# Patient Record
Sex: Male | Born: 1960 | Race: Black or African American | Hispanic: No | Smoking: Never smoker
Health system: Southern US, Community
[De-identification: ages and names within clinical notes are randomized; demographics above are authoritative.]

## PROBLEM LIST (undated history)

## (undated) DIAGNOSIS — G4730 Sleep apnea, unspecified: Secondary | ICD-10-CM

## (undated) DIAGNOSIS — M542 Cervicalgia: Secondary | ICD-10-CM

## (undated) DIAGNOSIS — E785 Hyperlipidemia, unspecified: Secondary | ICD-10-CM

## (undated) DIAGNOSIS — Z87442 Personal history of urinary calculi: Secondary | ICD-10-CM

## (undated) DIAGNOSIS — M1990 Unspecified osteoarthritis, unspecified site: Secondary | ICD-10-CM

## (undated) DIAGNOSIS — G8929 Other chronic pain: Secondary | ICD-10-CM

## (undated) DIAGNOSIS — M109 Gout, unspecified: Secondary | ICD-10-CM

## (undated) DIAGNOSIS — T7840XA Allergy, unspecified, initial encounter: Secondary | ICD-10-CM

## (undated) DIAGNOSIS — M2550 Pain in unspecified joint: Secondary | ICD-10-CM

## (undated) HISTORY — PX: KNEE ARTHROSCOPY: SUR90

## (undated) HISTORY — DX: Allergy, unspecified, initial encounter: T78.40XA

## (undated) HISTORY — PX: OTHER SURGICAL HISTORY: SHX169

## (undated) HISTORY — PX: CIRCUMCISION: SUR203

## (undated) HISTORY — PX: SHOULDER SURGERY: SHX246

## (undated) HISTORY — PX: VASECTOMY: SHX75

---

## 2001-03-09 HISTORY — PX: CERVICAL FUSION: SHX112

## 2002-12-27 ENCOUNTER — Encounter: Payer: Self-pay | Admitting: Neurosurgery

## 2002-12-27 ENCOUNTER — Ambulatory Visit (HOSPITAL_COMMUNITY): Admission: RE | Admit: 2002-12-27 | Discharge: 2002-12-28 | Payer: Self-pay | Admitting: Neurosurgery

## 2004-02-12 ENCOUNTER — Encounter: Admission: RE | Admit: 2004-02-12 | Discharge: 2004-02-12 | Payer: Self-pay | Admitting: *Deleted

## 2006-03-09 HISTORY — PX: CARPAL TUNNEL RELEASE: SHX101

## 2007-02-19 ENCOUNTER — Encounter: Admission: RE | Admit: 2007-02-19 | Discharge: 2007-02-19 | Payer: Self-pay | Admitting: Orthopedic Surgery

## 2007-04-28 ENCOUNTER — Ambulatory Visit (HOSPITAL_BASED_OUTPATIENT_CLINIC_OR_DEPARTMENT_OTHER): Admission: RE | Admit: 2007-04-28 | Discharge: 2007-04-28 | Payer: Self-pay | Admitting: Orthopedic Surgery

## 2007-07-06 ENCOUNTER — Encounter: Admission: RE | Admit: 2007-07-06 | Discharge: 2007-07-06 | Payer: Self-pay | Admitting: Orthopedic Surgery

## 2007-07-14 ENCOUNTER — Encounter: Admission: RE | Admit: 2007-07-14 | Discharge: 2007-07-14 | Payer: Self-pay | Admitting: Orthopedic Surgery

## 2007-07-27 ENCOUNTER — Encounter: Admission: RE | Admit: 2007-07-27 | Discharge: 2007-07-27 | Payer: Self-pay | Admitting: Orthopedic Surgery

## 2008-08-15 ENCOUNTER — Encounter: Admission: RE | Admit: 2008-08-15 | Discharge: 2008-08-15 | Payer: Self-pay | Admitting: Internal Medicine

## 2009-02-26 ENCOUNTER — Encounter (INDEPENDENT_AMBULATORY_CARE_PROVIDER_SITE_OTHER): Payer: Self-pay | Admitting: *Deleted

## 2009-04-04 ENCOUNTER — Encounter (INDEPENDENT_AMBULATORY_CARE_PROVIDER_SITE_OTHER): Payer: Self-pay

## 2009-04-05 ENCOUNTER — Ambulatory Visit: Payer: Self-pay | Admitting: Gastroenterology

## 2009-04-18 ENCOUNTER — Ambulatory Visit: Payer: Self-pay | Admitting: Gastroenterology

## 2009-04-19 ENCOUNTER — Encounter: Payer: Self-pay | Admitting: Gastroenterology

## 2010-02-13 ENCOUNTER — Encounter
Admission: RE | Admit: 2010-02-13 | Discharge: 2010-02-13 | Payer: Self-pay | Source: Home / Self Care | Admitting: Family Medicine

## 2010-04-10 NOTE — Miscellaneous (Signed)
Summary: Lec previsit  Clinical Lists Changes  Medications: Added new medication of MOVIPREP 100 GM  SOLR (PEG-KCL-NACL-NASULF-NA ASC-C) As per prep instructions. - Signed Rx of MOVIPREP 100 GM  SOLR (PEG-KCL-NACL-NASULF-NA ASC-C) As per prep instructions.;  #1 x 0;  Signed;  Entered by: Ulis Rias RN;  Authorized by: Meryl Dare MD [facility];  Method used: Electronically to [facility] [address], [place], [place], [place]  [address], Ph: [phone], Fax: [phone] Observations: Added new observation of NKA: T (04/05/2009 16:12)    Prescriptions: MOVIPREP 100 GM  SOLR (PEG-KCL-NACL-NASULF-NA ASC-C) As per prep instructions.  #1 x 0   Entered by:   Ulis Rias RN   Authorized by:   Meryl Dare MD [facility]   Signed by:   Ulis Rias RN on 04/05/2009   Method used:   Electronically to        [facility]  [address] (retail)       [address]       [place], [place]  [address]       Ph: [phone]       Fax: [phone]   RxID:   [id number]

## 2010-04-10 NOTE — Letter (Signed)
Summary: Moviprep Instructions  [facility]  [address]   [place], [place] [address]   Phone: [phone]  Fax: [phone]       [facility]    [birth year]    MRN: [id number]        Procedure Day /Date: Thursday 04-18-09     Arrival Time: 8:00 a.m.     Procedure Time: 9:00 a.m.     Location of Procedure:                    _x _  [facility] (4th Floor)                        PREPARATION FOR COLONOSCOPY WITH MOVIPREP   Starting 5 days prior to your procedure 04-13-09  do not eat nuts, seeds, popcorn, corn, beans, peas,  salads, or any raw vegetables.  Do not take any fiber supplements (e.g. Metamucil, Citrucel, and Benefiber).  THE DAY BEFORE YOUR PROCEDURE         DATE:  04-17-09   DAY: Wednesday  1.  Drink clear liquids the entire day-NO SOLID FOOD  2.  Do not drink anything colored red or purple.  Avoid juices with pulp.  No orange juice.  3.  Drink at least 64 oz. (8 glasses) of fluid/clear liquids during the day to prevent dehydration and help the prep work efficiently.  CLEAR LIQUIDS INCLUDE: Water Jello Ice Popsicles Tea (sugar ok, no milk/cream) Powdered fruit flavored drinks Coffee (sugar ok, no milk/cream) Gatorade Juice: apple, white grape, white cranberry  Lemonade Clear bullion, consomm, broth Carbonated beverages (any kind) Strained chicken noodle soup Hard Candy                             4.  In the morning, mix first dose of MoviPrep solution:    Empty 1 Pouch A and 1 Pouch B into the disposable container    Add lukewarm drinking water to the top line of the container. Mix to dissolve    Refrigerate (mixed solution should be used within 24 hrs)  5.  Begin drinking the prep at 5:00 p.m. The MoviPrep container is divided by 4 marks.   Every 15 minutes drink the solution down to the next mark (approximately 8 oz) until the full liter is complete.   6.  Follow completed prep with 16 oz of clear liquid of your choice  (Nothing red or purple).  Continue to drink clear liquids until bedtime.  7.  Before going to bed, mix second dose of MoviPrep solution:    Empty 1 Pouch A and 1 Pouch B into the disposable container    Add lukewarm drinking water to the top line of the container. Mix to dissolve    Refrigerate  THE DAY OF YOUR PROCEDURE      DATE: 04-18-09  DAY: Thursday  Beginning at 4:00 a.m. (5 hours before procedure):         1. Every 15 minutes, drink the solution down to the next mark (approx 8 oz) until the full liter is complete.  2. Follow completed prep with 16 oz. of clear liquid of your choice.    3. You may drink clear liquids until 7:00 a.m.  (2 HOURS BEFORE PROCEDURE).   MEDICATION INSTRUCTIONS  Unless otherwise instructed, you should take regular prescription medications with a small sip of water   as early  as possible the morning of your procedure.         OTHER INSTRUCTIONS  You will need a responsible adult at least 50 years of age to accompany you and drive you home.   This person must remain in the waiting room during your procedure.  Wear loose fitting clothing that is easily removed.  Leave jewelry and other valuables at home.  However, you may wish to bring a book to read or  an iPod/MP3 player to listen to music as you wait for your procedure to start.  Remove all body piercing jewelry and leave at home.  Total time from sign-in until discharge is approximately 2-3 hours.  You should go home directly after your procedure and rest.  You can resume normal activities the  day after your procedure.  The day of your procedure you should not:   Drive   Make legal decisions   Operate machinery   Drink alcohol   Return to work  You will receive specific instructions about eating, activities and medications before you leave.    The above instructions have been reviewed and explained to me by   Ulis Rias RN  April 05, 2009 4:23 PM     I fully  understand and can verbalize these instructions _____________________________ Date _________

## 2010-04-10 NOTE — Procedures (Signed)
Summary: Colonoscopy  Patient: [name] Note: All result statuses are Final unless otherwise noted.  Tests: (1) Colonoscopy (COL)   COL Colonoscopy           DONE     [facility]     [address]     [place], [place]  [address]           COLONOSCOPY PROCEDURE REPORT           PATIENT:  [name], [name]  MR#:  [id number]     BIRTHDATE:  December 18, 1960, 48 yrs. old  GENDER:  male           ENDOSCOPIST:  Judie Petit T. Russella Dar, MD, [facility]     Referred by:  Robert Bellow, M.D.           PROCEDURE DATE:  04/18/2009     PROCEDURE:  Colonoscopy with biopsy     ASA CLASS:  Class II     INDICATIONS:  1) Routine Risk Screening           MEDICATIONS:   Fentanyl 50 mcg IV, Versed 6 mg IV           DESCRIPTION OF PROCEDURE:   After the risks benefits and     alternatives of the procedure were thoroughly explained, informed     consent was obtained.  Digital rectal exam was performed and     revealed no abnormalities.   The LB PCF-H180AL [id number] endoscope     was introduced through the anus and advanced to the cecum, which     was identified by both the appendix and ileocecal valve, without     limitations.  The quality of the prep was excellent, using     MoviPrep.  The instrument was then slowly withdrawn as the colon     was fully examined.     <<PROCEDUREIMAGES>>           FINDINGS:  A sessile polyp was found in the cecum. It was 3 mm in     size. The polyp was removed using cold biopsy forceps.  Mild     diverticulosis was found in the sigmoid colon. This was otherwise     a normal examination of the colon. Retroflexed views in the rectum     revealed internal hemorrhoids, medium. The time to cecum =  1.75     minutes. The scope was then withdrawn (time =  11  min) from the     patient and the procedure completed.           COMPLICATIONS:  None           ENDOSCOPIC IMPRESSION:     1) 3 mm sessile polyp     2) Mild diverticulosis     3) Internal hemorrhoids        RECOMMENDATIONS:     1) Await pathology results     2) High fiber diet     3) If the polyp removed today is adenomatous (pre-cancerous),     you will need a repeat colonoscopy in 5 years. Otherwise you     should continue to follow colorectal cancer screening guidelines     for "routine risk" patients with colonoscopy in 10 years.     Venita Lick. Russella Dar, MD, [facility]           n.     eSIGNED:   Venita Lick. Stark at 04/18/2009 09:35 AM  Keith, Felten, [phone]  Note: An exclamation mark (!) indicates a result that was not dispersed into the flowsheet. Document Creation Date: 04/18/2009 9:35 AM _______________________________________________________________________  (1) Order result status: Final Collection or observation date-time: 04/18/2009 09:28 Requested date-time:  Receipt date-time:  Reported date-time:  Referring Physician:   Ordering Physician: Claudette Head [phone]) Specimen Source:  Source: Launa Grill Order Number: [id number] Lab site:   Appended Document: Colonoscopy     Procedures Next Due Date:    Colonoscopy: 04/2019

## 2010-04-10 NOTE — Letter (Signed)
Summary: Patient Notice- Colon Biospy Results  [facility]  [address] [place], [place] [address]   Phone: [phone]  Fax: [phone]        April 19, 2009 MRN: [id number]    [name] [facility] [address] [place], [place]  [address]    Dear [name],  I am pleased to inform you that the biopsies taken during your recent colonoscopy did not show any evidence of cancer upon pathologic examination. The biopsy showed polypoid normal mucosa. There was no polyp.  Continue with the treatment plan as outlined on the day of your      exam.  You should have a repeat colonoscopy examination in 10 years.  Please call us if you are having persistent problems or have questions about your condition that have not been fully answered at this time.  Sincerely,  Meryl Dare MD [facility]  This letter has been electronically signed by your physician.  Appended Document: Patient Notice- Colon Biospy Results Letter mailed 2.16.11

## 2010-07-22 NOTE — Op Note (Signed)
[name], [name] NO.:  [id number]   MEDICAL RECORD NO.:  [id number]          PATIENT TYPE:  AMB   LOCATION:  [facility]                         FACILITY:  [facility]   PHYSICIAN:  Deidre Ala, M.D.    DATE OF BIRTH:  Aug 12, 1960   DATE OF PROCEDURE:  04/28/2007  DATE OF DISCHARGE:                               OPERATIVE REPORT   PREOPERATIVE DIAGNOSIS:  Right carpal tunnel syndrome.   POSTOPERATIVE DIAGNOSIS:  Right carpal tunnel syndrome.   PROCEDURE:  Right carpal tunnel release.   SURGEON:  Doristine Section, MD.   ASSISTANT:  Phineas Semen, P.A.   ANESTHESIA:  General with LMA.   CULTURES:  None.   DRAINS:  None.   ESTIMATED BLOOD LOSS:  Minimal.   TOURNIQUET TIME:  25 minutes.   PATHOLOGIC FINDINGS AND HISTORY:  Wisdom has had shoulder arthroscopy with  good result, persistent shoulder and arm pain.  He has had effusion in  his neck, cervical 4-5 and there was a question of whether or not  recently there was some sort of breakdown above or below the fusion mass  or in the fusion mass with a six radiculopathy. The patient went to see  Dr. Alveda Reasons who felt like the most abnormal segment was 4-5 but that he  had a possibly carpal tunnel syndrome and nerve conduction studies were  done by Dr. Regino Schultze on March 23, 2007 which showed moderate severe right  median mononeuropathy. At this point, the feeling was that we should  release the right carpal tunnel. Sometimes the pain goes all the way  from the hand to the shoulder with carpal tunnel syndrome and that we  would then released the carpal tunnel, see what his symptoms were and if  he continued to have discomfort in the neck and arm consider selective  nerve root block right. At surgery, classic findings were noted with  hour glassing with a thick transverse carpal ligament with a thick  palmar fascia.  The nerve was well released, all branches traced  distally including the motor branch and the nerve release  well up into  the forearm on the ulnar side.   DESCRIPTION OF PROCEDURE:  With adequate anesthesia obtained using LMA  technique, 1 gram Ancef given IV prophylaxis, the patient was placed in  the supine position. The right upper extremity was prepped from the  fingertips to the upper forearm in a standard fashion. After standard  prepping and draping, Esmarch examination was used, the tourniquet was  let up 275 mmHg.  A longitudinal incision was then made at the base of  the palm in the midline to the distal wrist flexion crease.  The  incision was deepened sharply with the knife and hemostasis obtained  using the Bovie electrocoagulator. Under loupe magnification, dissection  was carried down to the palmar fascia which was then entered by placing  a Therapist, nutritional underneath it, protecting the nerve and cutting down  upon it with a 64 Beaver blade.  This exposed the nerve. Careful scissor  neurolysis was then carried out with tenotomies  and all branches were  traced distal including the motor branch.  Epineurium was removed from  the volar one-half of the nerve. The distal wrist flexor retinaculum was  then released on the ulnar side of the nerve well up into the forearm  and part of the transverse retinaculum was excised. The nerve was then  felt to be decompressed. Irrigation was carried out. The wound was then  closed with interrupted and running for 4-0 nylon.  A bulky sterile  compressive dressing was applied with volar plaster splint in slight  cock-up.  The patient then having tolerated the procedure well was  awakened and taken to the recovery room in satisfactory condition to be  discharged per outpatient routine, given Percocet for pain and told to  call the office for a recheck on Monday.           ______________________________  V. Charlesetta Shanks, M.D.     VEP/MEDQ  D:  04/28/2007  T:  04/29/2007  Job:  [id number]   cc:   Claria Dice, MD   Nelda Severe, MD  Fax: [phone]

## 2010-07-25 NOTE — Op Note (Signed)
[name], [name]                            ACCOUNT NO.:  [id number]   MEDICAL RECORD NO.:  [id number]                   PATIENT TYPE:  OIB   LOCATION:  3172                                 FACILITY:  [facility]   PHYSICIAN:  Hewitt Shorts, M.D.            DATE OF BIRTH:  December 09, 1960   DATE OF PROCEDURE:  12/27/2002  DATE OF DISCHARGE:                                 OPERATIVE REPORT   PREOPERATIVE DIAGNOSES:  1. 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  2. Spondylosis.  3. Degenerative disk disease.  4. Radiculopathy.   POSTOPERATIVE DIAGNOSES:  1. 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  2. Spondylosis.  3. Degenerative disk disease.  4. Radiculopathy.   PROCEDURES:  C5-6 and C6-7 anterior cervical diskectomy and decompression  and arthrodesis with iliac crest Allograft and Tether cervical plating.   SURGEON:  Hewitt Shorts, M.D.   ASSISTANT:  Clydene Fake, M.D.   ANESTHESIA:  General endotracheal anesthesia.   INDICATIONS FOR PROCEDURE:  The patient is a 50 year old man who presented  with neck pain and right cervical radiculopathy who was found on MRI scan to  have disk herniations at C5-6 and C6-7.  The decision was made to proceed  with a two level anterior cervical diskectomy and arthrodesis.   PROCEDURE:  The patient was brought to the operating room and placed under  general endotracheal anesthesia.  The patient was placed in ten pounds of  halter traction.  The neck was draped with Betadine soap solution and draped  in sterile fashion.  A horizontal incision was made on the left side of the  neck.  The line of incision was infiltrated with 1% lidocaine with  epinephrine.  Dissection was carried down through the subcutaneous tissue  and platysma.  Then dissection was carried out through an avascular plane  leaving the sternocleidomastoid, carotid artery and jugular vein laterally  and the trachea and esophagus medially.  The ventral aspect of the  vertebral  column was identified and a localizing x-ray taken.  We were able to  identify the C4-5 and C5-6 intervertebral disk spaces and thereby the C6-7  intervertebral disk space.  We then proceeded with diskectomy at the C5-6  and C6-7 levels with an incision of the anterior annulus.  The diskectomy  was continued with microcurettes and pituitary rongeurs.  Anterior  osteophytic overgrowth was removed using the osteophyte removal tool.  The  cauterized end plates of the corresponding vertebrae were removed using  microcurettes along with the MicroVAX drill.  Then the microscope was draped  and brought on to the field to provide microdissection, illumination and  visualization and the remainder of the diskectomy and compression was  performed using microdissection and microsurgical technique.  The posterior  osteophytic overgrowth was removed using the MicroVAX drill along with the 2  mm Kerrison punch with a thin foot plate.  The posterior longitudinal  ligament was carefully opened and removed at each level and the disk  herniation was removed.  We then completed the decompression of the spinal  canal and thecal sac as well as the foramina and nerve roots bilaterally at  each level.  Once the decompression was completed hemostasis was established  with the use of Gelfoam soaked in Thrombin. We then selected two 7 mm in  height grafts of tricortical iliac crest Allograft.  The size had been  selected using spacers to size the intervertebral disk space at each level.  Once the grafts were opened we then hydrated them in saline and their  position in the intervertebral disk space at each level and counter sunk. We  then selected a 32 mm tether cervical plate which was positioned over the  fusion construct and secured to the vertebra with a variety of screws.  At  C5 the left screw was a 4.5 x 15 mm screw and the left side was a 4.0 x 14  mm screw.  At C6 there was a 4.5 x 13 mm screw and  at C7 it was a pair of  4.0 x 14 mm screws.  Each screw hole was drilled and tapped and the screws  were placed in an alternating fashion.  They were fully tightened.  The  wound was then irrigated with Bacitracin solution and checked for  hemostasis.  When this was established and confirmed then we proceeded with  closure.  An x-ray was not taken due to the fact that the patient's  shoulders were large and we were unable to see the C5-6 and C6-7 disk spaces  on the initial x-ray.  It was not felt that we would be able to visualize  them.  However, under direct visualization the fusion construct appeared  good with good alignment and the bone grafts in good position.  The platysma  was closed with interrupted inverted 2-0 running Vicryl sutures.  The  subcutaneous and subcuticular layers were closed with interrupted buried 3-0  running Vicryl sutures.  The skin was reapproximated with Dermabond.  The  patient tolerated the procedure well.  Estimated blood loss was 100 mL.  Sponge and needle counts were correct. Following the surgery the patient who  had been taken out of cervical traction once the bone grafts were in place  and prior to plating with reverse anesthetic, extubated and transferred to  the recovery room for further care where he was noted to be moving all four  extremities to command.                                               Hewitt Shorts, M.D.    RWN/MEDQ  D:  12/27/2002  T:  12/27/2002  Job:  [id number]

## 2010-11-28 LAB — POCT HEMOGLOBIN-HEMACUE: Hemoglobin: 15

## 2011-03-25 ENCOUNTER — Ambulatory Visit (INDEPENDENT_AMBULATORY_CARE_PROVIDER_SITE_OTHER): Payer: 59

## 2011-03-25 DIAGNOSIS — J40 Bronchitis, not specified as acute or chronic: Secondary | ICD-10-CM

## 2011-03-25 DIAGNOSIS — G4733 Obstructive sleep apnea (adult) (pediatric): Secondary | ICD-10-CM

## 2011-03-25 DIAGNOSIS — J069 Acute upper respiratory infection, unspecified: Secondary | ICD-10-CM

## 2011-04-14 ENCOUNTER — Other Ambulatory Visit: Payer: Self-pay | Admitting: Internal Medicine

## 2011-04-14 MED ORDER — CLINDAMYCIN PHOSPHATE 1 % EX LOTN: TOPICAL_LOTION | Freq: Two times a day (BID) | CUTANEOUS | Status: DC

## 2011-05-14 ENCOUNTER — Other Ambulatory Visit: Payer: Self-pay | Admitting: Orthopedic Surgery

## 2011-05-18 ENCOUNTER — Encounter (HOSPITAL_BASED_OUTPATIENT_CLINIC_OR_DEPARTMENT_OTHER): Payer: Self-pay

## 2011-05-18 ENCOUNTER — Encounter (HOSPITAL_BASED_OUTPATIENT_CLINIC_OR_DEPARTMENT_OTHER): Payer: Self-pay | Admitting: Anesthesiology

## 2011-05-18 ENCOUNTER — Ambulatory Visit (HOSPITAL_BASED_OUTPATIENT_CLINIC_OR_DEPARTMENT_OTHER): Payer: 59 | Admitting: Anesthesiology

## 2011-05-18 ENCOUNTER — Ambulatory Visit (HOSPITAL_BASED_OUTPATIENT_CLINIC_OR_DEPARTMENT_OTHER)
Admission: RE | Admit: 2011-05-18 | Discharge: 2011-05-18 | Disposition: A | Discharge status: Home / Self Care | Payer: 59 | Source: Ambulatory Visit | Attending: Orthopedic Surgery | Admitting: Orthopedic Surgery

## 2011-05-18 ENCOUNTER — Encounter (HOSPITAL_BASED_OUTPATIENT_CLINIC_OR_DEPARTMENT_OTHER)
Admission: RE | Disposition: A | Discharge status: Home / Self Care | Payer: Self-pay | Source: Ambulatory Visit | Attending: Orthopedic Surgery

## 2011-05-18 ENCOUNTER — Encounter (HOSPITAL_BASED_OUTPATIENT_CLINIC_OR_DEPARTMENT_OTHER): Payer: Self-pay | Admitting: Certified Registered Nurse Anesthetist

## 2011-05-18 DIAGNOSIS — M66329 Spontaneous rupture of flexor tendons, unspecified upper arm: Secondary | ICD-10-CM | POA: Insufficient documentation

## 2011-05-18 DIAGNOSIS — M7541 Impingement syndrome of right shoulder: Secondary | ICD-10-CM

## 2011-05-18 DIAGNOSIS — G4730 Sleep apnea, unspecified: Secondary | ICD-10-CM | POA: Insufficient documentation

## 2011-05-18 DIAGNOSIS — Z5333 Arthroscopic surgical procedure converted to open procedure: Secondary | ICD-10-CM | POA: Insufficient documentation

## 2011-05-18 DIAGNOSIS — M129 Arthropathy, unspecified: Secondary | ICD-10-CM | POA: Insufficient documentation

## 2011-05-18 DIAGNOSIS — M25819 Other specified joint disorders, unspecified shoulder: Secondary | ICD-10-CM | POA: Insufficient documentation

## 2011-05-18 DIAGNOSIS — Z01812 Encounter for preprocedural laboratory examination: Secondary | ICD-10-CM | POA: Insufficient documentation

## 2011-05-18 DIAGNOSIS — M9420 Chondromalacia, unspecified site: Secondary | ICD-10-CM | POA: Insufficient documentation

## 2011-05-18 HISTORY — DX: Sleep apnea, unspecified: G47.30

## 2011-05-18 SURGERY — SHOULDER ARTHROSCOPY WITH SUBACROMIAL DECOMPRESSION
Anesthesia: General | Site: Shoulder | Laterality: Right | Wound class: Clean

## 2011-05-18 MED ORDER — PROPOFOL 10 MG/ML IV EMUL
INTRAVENOUS | Status: DC | PRN
  Administered 2011-05-18: 300 mg via INTRAVENOUS
  Administered 2011-05-18: 30 mg via INTRAVENOUS

## 2011-05-18 MED ORDER — POVIDONE-IODINE 7.5 % EX SOLN: Freq: Once | CUTANEOUS | Status: DC

## 2011-05-18 MED ORDER — SUCCINYLCHOLINE CHLORIDE 20 MG/ML IJ SOLN
INTRAMUSCULAR | Status: DC | PRN
  Administered 2011-05-18: 100 mg via INTRAVENOUS

## 2011-05-18 MED ORDER — SODIUM CHLORIDE 0.9 % IR SOLN
Status: DC | PRN
  Administered 2011-05-18: 8500 mL

## 2011-05-18 MED ORDER — FENTANYL CITRATE 0.05 MG/ML IJ SOLN
50.0000 ug | INTRAMUSCULAR | Status: DC | PRN
  Administered 2011-05-18: 100 ug via INTRAVENOUS

## 2011-05-18 MED ORDER — LIDOCAINE HCL (CARDIAC) 20 MG/ML IV SOLN
INTRAVENOUS | Status: DC | PRN
  Administered 2011-05-18: 50 mg via INTRAVENOUS

## 2011-05-18 MED ORDER — BUPIVACAINE HCL 0.25 % IJ SOLN
INTRAMUSCULAR | Status: DC | PRN
  Administered 2011-05-18: 20 mL

## 2011-05-18 MED ORDER — MIDAZOLAM HCL 2 MG/2ML IJ SOLN
1.0000 mg | INTRAMUSCULAR | Status: DC | PRN
  Administered 2011-05-18: 2 mg via INTRAVENOUS

## 2011-05-18 MED ORDER — OXYCODONE-ACETAMINOPHEN 5-325 MG PO TABS: 1.0000 | ORAL_TABLET | ORAL | Status: AC | PRN

## 2011-05-18 MED ORDER — DEXAMETHASONE SODIUM PHOSPHATE 4 MG/ML IJ SOLN
INTRAMUSCULAR | Status: DC | PRN
  Administered 2011-05-18: 10 mg via INTRAVENOUS

## 2011-05-18 MED ORDER — EPHEDRINE SULFATE 50 MG/ML IJ SOLN
INTRAMUSCULAR | Status: DC | PRN
  Administered 2011-05-18: 10 mg via INTRAVENOUS

## 2011-05-18 MED ORDER — BUPIVACAINE-EPINEPHRINE PF 0.5-1:200000 % IJ SOLN
INTRAMUSCULAR | Status: DC | PRN
  Administered 2011-05-18: 30 mL

## 2011-05-18 MED ORDER — LACTATED RINGERS IV SOLN
INTRAVENOUS | Status: DC
  Administered 2011-05-18: 11:00:00 via INTRAVENOUS

## 2011-05-18 MED ORDER — GLYCOPYRROLATE 0.2 MG/ML IJ SOLN
INTRAMUSCULAR | Status: DC | PRN
  Administered 2011-05-18: 0.2 mg via INTRAVENOUS

## 2011-05-18 MED ORDER — ONDANSETRON HCL 4 MG/2ML IJ SOLN
INTRAMUSCULAR | Status: DC | PRN
  Administered 2011-05-18: 4 mg via INTRAVENOUS

## 2011-05-18 MED ORDER — CEFAZOLIN SODIUM-DEXTROSE 2-3 GM-% IV SOLR
2.0000 g | INTRAVENOUS | Status: AC
  Administered 2011-05-18: 2 g via INTRAVENOUS

## 2011-05-18 SURGICAL SUPPLY — 94 items
BENZOIN TINCTURE PRP APPL 2/3 (GAUZE/BANDAGES/DRESSINGS) IMPLANT
BIT DRILL 3.6X128 (BIT) ×2 IMPLANT
BIT DRILL 4.8MMDIAX5IN DISPOSE (BIT) ×1 IMPLANT
BLADE 4.2CUDA (BLADE) IMPLANT
BLADE CUDA 5.5 (BLADE) IMPLANT
BLADE CUTTER GATOR 3.5 (BLADE) IMPLANT
BLADE GREAT WHITE 4.2 (BLADE) IMPLANT
BLADE SURG 15 STRL LF DISP TIS (BLADE) ×1 IMPLANT
BLADE SURG 15 STRL SS (BLADE) ×1
BLADE SURG ROTATE 9660 (MISCELLANEOUS) IMPLANT
BLADE VORTEX 6.0 (BLADE) IMPLANT
BUR 3.5 LG SPHERICAL (BURR) IMPLANT
BUR OVAL 4.0 (BURR) ×2 IMPLANT
BUR OVAL 6.0 (BURR) IMPLANT
BURR 3.5 LG SPHERICAL (BURR)
CANISTER OMNI JUG 16 LITER (MISCELLANEOUS) ×2 IMPLANT
CANISTER SUCTION 2500CC (MISCELLANEOUS) IMPLANT
CANNULA 5.75X71 LONG (CANNULA) ×4 IMPLANT
CANNULA TWIST IN 8.25X7CM (CANNULA) IMPLANT
CHLORAPREP W/TINT 26ML (MISCELLANEOUS) ×2 IMPLANT
CLOTH BEACON ORANGE TIMEOUT ST (SAFETY) ×2 IMPLANT
DECANTER SPIKE VIAL GLASS SM (MISCELLANEOUS) IMPLANT
DERMABOND ADVANCED (GAUZE/BANDAGES/DRESSINGS) ×1
DERMABOND ADVANCED .7 DNX12 (GAUZE/BANDAGES/DRESSINGS) ×1 IMPLANT
DRAPE INCISE IOBAN 66X45 STRL (DRAPES) ×2 IMPLANT
DRAPE OEC MINIVIEW 54X84 (DRAPES) IMPLANT
DRAPE STERI 35X30 U-POUCH (DRAPES) ×2 IMPLANT
DRAPE SURG 17X23 STRL (DRAPES) ×2 IMPLANT
DRAPE U 20/CS (DRAPES) ×2 IMPLANT
DRAPE U-SHAPE 47X51 STRL (DRAPES) ×2 IMPLANT
DRAPE U-SHAPE 76X120 STRL (DRAPES) ×4 IMPLANT
DRILL BIT 4.8MMDIAX5IN DISPOSE (BIT) ×2
DRSG PAD ABDOMINAL 8X10 ST (GAUZE/BANDAGES/DRESSINGS) ×2 IMPLANT
ELECT REM PT RETURN 9FT ADLT (ELECTROSURGICAL) ×2
ELECTRODE REM PT RTRN 9FT ADLT (ELECTROSURGICAL) ×1 IMPLANT
GAUZE SPONGE 4X4 16PLY XRAY LF (GAUZE/BANDAGES/DRESSINGS) IMPLANT
GAUZE XEROFORM 1X8 LF (GAUZE/BANDAGES/DRESSINGS) ×2 IMPLANT
GLOVE BIO SURGEON STRL SZ7.5 (GLOVE) ×4 IMPLANT
GLOVE BIOGEL M STRL SZ7.5 (GLOVE) ×2 IMPLANT
GLOVE BIOGEL PI IND STRL 8 (GLOVE) ×2 IMPLANT
GLOVE BIOGEL PI INDICATOR 8 (GLOVE) ×2
GLOVE INDICATOR 8.0 STRL GRN (GLOVE) ×4 IMPLANT
GLOVE SURG SS PI 8.0 STRL IVOR (GLOVE) ×2 IMPLANT
GOWN PREVENTION PLUS XLARGE (GOWN DISPOSABLE) ×4 IMPLANT
GOWN PREVENTION PLUS XXLARGE (GOWN DISPOSABLE) ×2 IMPLANT
LASSO CRESCENT QUICKPASS (SUTURE) ×2 IMPLANT
NDL SUT 6 .5 CRC .975X.05 MAYO (NEEDLE) IMPLANT
NEEDLE 1/2 CIR CATGUT .05X1.09 (NEEDLE) IMPLANT
NEEDLE MAYO TAPER (NEEDLE)
NEEDLE SCORPION MULTI FIRE (NEEDLE) IMPLANT
NS IRRIG 1000ML POUR BTL (IV SOLUTION) IMPLANT
PACK ARTHROSCOPY DSU (CUSTOM PROCEDURE TRAY) ×2 IMPLANT
PACK BASIN DAY SURGERY FS (CUSTOM PROCEDURE TRAY) ×2 IMPLANT
PENCIL BUTTON HOLSTER BLD 10FT (ELECTRODE) ×2 IMPLANT
RESECTOR FULL RADIUS 4.2MM (BLADE) ×2 IMPLANT
RESECTOR FULL RADIUS 4.8MM (BLADE) IMPLANT
SHEET MEDIUM DRAPE 40X70 STRL (DRAPES) IMPLANT
SLEEVE SCD COMPRESS KNEE MED (MISCELLANEOUS) ×2 IMPLANT
SLING ARM FOAM STRAP LRG (SOFTGOODS) IMPLANT
SLING ARM FOAM STRAP MED (SOFTGOODS) IMPLANT
SLING ARM FOAM STRAP XLG (SOFTGOODS) ×2 IMPLANT
SLING ARM IMMOBILIZER MED (SOFTGOODS) IMPLANT
SPONGE GAUZE 4X4 12PLY (GAUZE/BANDAGES/DRESSINGS) ×2 IMPLANT
SPONGE LAP 4X18 X RAY DECT (DISPOSABLE) ×4 IMPLANT
STRIP CLOSURE SKIN 1/2X4 (GAUZE/BANDAGES/DRESSINGS) IMPLANT
SUCTION FRAZIER TIP 10 FR DISP (SUCTIONS) IMPLANT
SUPPORT WRAP ARM LG (MISCELLANEOUS) IMPLANT
SUT 2 FIBERLOOP 20 STRT BLUE (SUTURE) ×2
SUT BONE WAX W31G (SUTURE) IMPLANT
SUT ETHIBOND 2 OS 4 DA (SUTURE) IMPLANT
SUT ETHILON 3 0 PS 1 (SUTURE) ×2 IMPLANT
SUT ETHILON 4 0 PS 2 18 (SUTURE) IMPLANT
SUT FIBERWIRE #2 38 T-5 BLUE (SUTURE)
SUT FIBERWIRE 2-0 18 17.9 3/8 (SUTURE)
SUT MNCRL AB 3-0 PS2 18 (SUTURE) IMPLANT
SUT MNCRL AB 4-0 PS2 18 (SUTURE) IMPLANT
SUT PDS AB 0 CT 36 (SUTURE) IMPLANT
SUT PROLENE 3 0 PS 2 (SUTURE) IMPLANT
SUT VIC AB 0 CT1 18XCR BRD 8 (SUTURE) IMPLANT
SUT VIC AB 0 CT1 8-18 (SUTURE)
SUT VIC AB 2-0 SH 18 (SUTURE) ×2 IMPLANT
SUT VIC AB 2-0 SH 27 (SUTURE) ×1
SUT VIC AB 2-0 SH 27XBRD (SUTURE) ×1 IMPLANT
SUTURE 2 FIBERLOOP 20 STRT BLU (SUTURE) ×1 IMPLANT
SUTURE FIBERWR #2 38 T-5 BLUE (SUTURE) IMPLANT
SUTURE FIBERWR 2-0 18 17.9 3/8 (SUTURE) IMPLANT
SYR BULB 3OZ (MISCELLANEOUS) IMPLANT
TOWEL OR 17X24 6PK STRL BLUE (TOWEL DISPOSABLE) ×4 IMPLANT
TOWEL OR NON WOVEN STRL DISP B (DISPOSABLE) ×2 IMPLANT
TUBE CONNECTING 20X1/4 (TUBING) ×2 IMPLANT
TUBING ARTHROSCOPY IRRIG 16FT (MISCELLANEOUS) ×2 IMPLANT
WAND STAR VAC 90 (SURGICAL WAND) ×2 IMPLANT
WATER STERILE IRR 1000ML POUR (IV SOLUTION) ×2 IMPLANT
YANKAUER SUCT BULB TIP NO VENT (SUCTIONS) IMPLANT

## 2011-05-18 NOTE — Progress Notes (Signed)
Assisted Dr. Jackson with right, interscalene  block. Side rails up, monitors on throughout procedure. See vital signs in flow sheet. Tolerated Procedure well. 

## 2011-05-18 NOTE — Anesthesia Procedure Notes (Addendum)
Anesthesia Regional Block:  Interscalene brachial plexus block  Pre-Anesthetic Checklist: ,, timeout performed, Correct Patient, Correct Site, Correct Laterality, Correct Procedure, Correct Position, site marked, Risks and benefits discussed,  Surgical consent,  Pre-op evaluation,  At surgeon's request and post-op pain management  Laterality: Right  Prep: chloraprep       Needles:  Injection technique: Single-shot  Needle Type: Stimulator Needle - 40      Needle Gauge: 22 and 22 G    Additional Needles:  Procedures: nerve stimulator Interscalene brachial plexus block  Nerve Stimulator or Paresthesia:  Response: forearm twitch, 0.48 mA, 0.1 ms,   Additional Responses:   Narrative:  Start time: 05/18/2011 12:13 PM End time: 05/18/2011 12:20 PM Injection made incrementally with aspirations every 5 mL.  Performed by: Personally  Anesthesiologist: Sandford Craze, MD  Additional Notes: Pt identified in Holding room.  Monitors applied. Working IV access confirmed. Sterile prep R neck.  #22ga PNS to forearm twitch at 0.59mA threshold.  30cc 0.5% Bupivacaine with 1:200k epi injected incrementally after negative test dose.  Patient asymptomatic, VSS, no heme aspirated, tolerated well.   Sandford Craze, MD  Interscalene brachial plexus block Procedure Name: Intubation Performed by: York Grice Pre-anesthesia Checklist: Patient identified, Timeout performed, Emergency Drugs available, Suction available and Patient being monitored Patient Re-evaluated:Patient Re-evaluated prior to inductionOxygen Delivery Method: Circle system utilized Preoxygenation: Pre-oxygenation with 100% oxygen Intubation Type: IV induction Ventilation: Mask ventilation without difficulty Laryngoscope Size: 2 and Miller Grade View: Grade II Tube size: 8.0 mm Number of attempts: 2 Placement Confirmation: ETT inserted through vocal cords under direct vision,  positive ETCO2 and breath sounds checked- equal and  bilateral Secured at: 24 cm Tube secured with: Tape Dental Injury: Teeth and Oropharynx as per pre-operative assessment

## 2011-05-18 NOTE — H&P (Signed)
[name] is an 51 y.o. male.   Chief Complaint: R shoulder pain  HPI: S/p prior R shoulder surgery with severe calcific tendonitis/ bony formation.  Failed conservative management.  Past Medical History  Diagnosis Date  . Sleep apnea     Past Surgical History  Procedure Date  . Shoulder surgery 2003 and 1999    Right and left 3 years apart  . Carpal tunnel release 2008  . Cervical fusion 2003    History reviewed. No pertinent family history. Social History:  does not have a smoking history on file. He does not have any smokeless tobacco history on file. He reports that he does not drink alcohol. His drug history not on file.  Allergies: No Known Allergies  Medications Prior to Admission  Medication Dose Route Frequency Provider Last Rate Last Dose  . ceFAZolin (ANCEF) IVPB 2 g/50 mL premix  2 g Intravenous 60 min Pre-Op Mable Paris, MD      . fentaNYL (SUBLIMAZE) injection 50-100 mcg  50-100 mcg Intravenous PRN E. Jairo Ben, MD      . lactated ringers infusion   Intravenous Continuous Germaine Pomfret, MD 20 mL/hr at 05/18/11 1104    . midazolam (VERSED) injection 1-2 mg  1-2 mg Intravenous PRN E. Jairo Ben, MD      . povidone-iodine (BETADINE) 7.5 % scrub   Topical Once Mable Paris, MD       Medications Prior to Admission  Medication Sig Dispense Refill  . allopurinol (ZYLOPRIM) 100 MG tablet Take 100 mg by mouth 2 (two) times daily.      Marland Kitchen aspirin EC 81 MG tablet Take 81 mg by mouth daily.      . cetirizine (ZYRTEC) 10 MG tablet Take 10 mg by mouth daily.      . clindamycin (CLEOCIN T) 1 % lotion Apply topically 2 (two) times daily.  60 mL  1  . colchicine 0.6 MG tablet Take 0.6 mg by mouth daily.      . simvastatin (ZOCOR) 40 MG tablet Take 40 mg by mouth every evening.        Results for orders placed during the hospital encounter of 05/18/11 (from the past 48 hour(s))  POCT HEMOGLOBIN-HEMACUE     Status: Normal   Collection  Time   05/18/11 10:39 AM      Component Value Range Comment   Hemoglobin 14.6  13.0 - 17.0 (g/dL)    No results found.  Review of Systems  All other systems reviewed and are negative.    Blood pressure 151/94, pulse 60, temperature 98.5 F (36.9 C), temperature source Oral, resp. rate 20, height 5' 10.5" (1.791 m), weight 113.399 kg (250 lb). Physical Exam  Constitutional: He is oriented to person, place, and time. He appears well-developed and well-nourished.  HENT:  Head: Atraumatic.  Eyes: EOM are normal.  Cardiovascular: Intact distal pulses.   Respiratory: Effort normal.  Musculoskeletal:       Right shoulder: He exhibits tenderness and pain.  Neurological: He is alert and oriented to person, place, and time.  Skin: Skin is warm and dry.     Assessment/Plan R shoulder severe calcific tendonitis Plan for arthroscopic debridement, SAD Risks / benefits of surgery discussed Consent on chart  NPO for OR Preop antibiotics   Cindy Brindisi WILLIAM 05/18/2011, 12:15 PM

## 2011-05-18 NOTE — Anesthesia Preprocedure Evaluation (Signed)
Anesthesia Evaluation  Patient identified by MRN, date of birth, ID band Patient awake    Reviewed: Allergy & Precautions, H&P , NPO status , Patient's Chart, lab work & pertinent test results  History of Anesthesia Complications Negative for: history of anesthetic complications  Airway Mallampati: II TM Distance: >3 FB Neck ROM: Limited    Dental No notable dental hx. (+) Teeth Intact and Dental Advisory Given   Pulmonary sleep apnea and Continuous Positive Airway Pressure Ventilation ,  breath sounds clear to auscultation  Pulmonary exam normal       Cardiovascular negative cardio ROS  Rhythm:Regular Rate:Normal     Neuro/Psych negative neurological ROS  negative psych ROS   GI/Hepatic negative GI ROS, Neg liver ROS,   Endo/Other  Morbid obesity  Renal/GU negative Renal ROS     Musculoskeletal  (+) Arthritis -,   Abdominal (+) + obese,   Peds  Hematology negative hematology ROS (+)   Anesthesia Other Findings   Reproductive/Obstetrics                           Anesthesia Physical Anesthesia Plan  ASA: III  Anesthesia Plan: General   Post-op Pain Management:    Induction: Intravenous  Airway Management Planned: Oral ETT  Additional Equipment:   Intra-op Plan:   Post-operative Plan: Extubation in OR  Informed Consent: I have reviewed the patients History and Physical, chart, labs and discussed the procedure including the risks, benefits and alternatives for the proposed anesthesia with the patient or authorized representative who has indicated his/her understanding and acceptance.   Dental advisory given  Plan Discussed with: CRNA and Surgeon  Anesthesia Plan Comments: (Plan routine monitors, GETA with interscalene block for post op analgesia  )        Anesthesia Quick Evaluation

## 2011-05-18 NOTE — Discharge Instructions (Signed)
Discharge Instructions after Arthroscopic Shoulder Surgery   A sling has been provided for you. You may remove the sling after 72 hours. The sling may be worn for your protection, if you are in a crowd.  Use ice on the shoulder intermittently over the first 48 hours after surgery.  Pain medication has been prescribed for you.  Use your medication liberally over the first 48 hours, and then begin to taper your use. You may take Extra Strength Tylenol or Tylenol only in place of the pain pills. DO NOT take ANY nonsteroidal anti-inflammatory pain medications: Advil, Motrin, Ibuprofen, Aleve, Naproxen, or Naprosyn.  You may remove your dressing after two days.  You may shower 5 days after surgery. The incision CANNOT get wet prior to 5 days. Simply allow the water to wash over the site and then pat dry. Do not rub the incision. Make sure your axilla (armpit) is completely dry after showering.  Take one aspirin a day for 2 weeks after surgery, unless you have an aspirin sensitivity/allergy or asthma.  Three to 5 times each day you should perform assisted overhead reaching and external rotation (outward turning) exercises with the operative arm. Both exercises should be done with the non-operative arm used as the "therapist arm" while the operative arm remains relaxed. Ten of each exercise should be done three to five times each day. YOUR ONLY RESTRICTION IS THAT I DON'T WANT YOU FLEXING YOUR ELBOW AGAINST ANY FORCE X 4WKS    Overhead reach is helping to lift your stiff arm up as high as it will go. To stretch your overhead reach, lie flat on your back, relax, and grasp the wrist of the tight shoulder with your opposite hand. Using the power in your opposite arm, bring the stiff arm up as far as it is comfortable. Start holding it for ten seconds and then work up to where you can hold it for a count of 30. Breathe slowly and deeply while the arm is moved. Repeat this stretch ten times, trying to help the  arm up a little higher each time.       External rotation is turning the arm out to the side while your elbow stays close to your body. External rotation is best stretched while you are lying on your back. Hold a cane, yardstick, broom handle, or dowel in both hands. Bend both elbows to a right angle. Use steady, gentle force from your normal arm to rotate the hand of the stiff shoulder out away from your body. Continue the rotation as far as it will go comfortably, holding it there for a count of 10. Repeat this exercise ten times.     Please call [phone] during normal business hours or [phone] after hours for any problems. Including the following:  - excessive redness of the incisions - drainage for more than 4 days - fever of more than 101.5 F  *Please note that pain medications will not be refilled after hours or on weekends.  Regional Anesthesia Blocks  1. Numbness or the inability to move the "blocked" extremity may last from 3-48 hours after placement. The length of time depends on the medication injected and your individual response to the medication. If the numbness is not going away after 48 hours, call your surgeon.  2. The extremity that is blocked will need to be protected until the numbness is gone and the  Strength has returned. Because you cannot feel it, you will need to take extra care  to avoid injury. Because it may be weak, you may have difficulty moving it or using it. You may not know what position it is in without looking at it while the block is in effect.  3. For blocks in the legs and feet, returning to weight bearing and walking needs to be done carefully. You will need to wait until the numbness is entirely gone and the strength has returned. You should be able to move your leg and foot normally before you try and bear weight or walk. You will need someone to be with you when you first try to ensure you do not fall and possibly risk injury.  4. Bruising  and tenderness at the needle site are common side effects and will resolve in a few days.  5. Persistent numbness or new problems with movement should be communicated to the surgeon or the [facility] [phone]).   [facility]  [address] [place], [place] [address] [phone]   Post Anesthesia Home Care Instructions  Activity: Get plenty of rest for the remainder of the day. A responsible adult should stay with you for 24 hours following the procedure.  For the next 24 hours, DO NOT: -Drive a car -Advertising copywriter -Drink alcoholic beverages -Take any medication unless instructed by your physician -Make any legal decisions or sign important papers.  Meals: Start with liquid foods such as gelatin or soup. Progress to regular foods as tolerated. Avoid greasy, spicy, heavy foods. If nausea and/or vomiting occur, drink only clear liquids until the nausea and/or vomiting subsides. Call your physician if vomiting continues.  Special Instructions/Symptoms: Your throat may feel dry or sore from the anesthesia or the breathing tube placed in your throat during surgery. If this causes discomfort, gargle with warm salt water. The discomfort should disappear within 24 hours.

## 2011-05-18 NOTE — Op Note (Signed)
Procedure(s): SHOULDER ARTHROSCOPY WITH SUBACROMIAL DECOMPRESSION Procedure Note  DREAM NODAL male 51 y.o. 05/18/2011  Procedure(s) and Anesthesia Type:    * SHOULDER ARTHROSCOPY WITH SUBACROMIAL DECOMPRESSION and removal of large free bony fragments, extensive debridement of glenoid and humeral head chondromalacia, arthroscopic biceps tenotomy, and open subpectoral biceps tenodesis. 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  Surgeon(s) and Role:    * Mable Paris, MD - Primary     Surgeon: Mable Paris   Assistants: None  Anesthesia: General endotracheal anesthesia and regional    Procedure Detail  Estimated Blood Loss: Min         Drains: none  Blood Given: none         Specimens: Bony fragments were discarded after documentation        Complications:  * No complications entered in OR log *         Disposition: PACU - hemodynamically stable.         Condition: stable    Procedure:   INDICATIONS FOR SURGERY: The patient is 51 y.o. male who has had a long history of right shoulder pain. He previously had arthroscopic surgery several years ago. He has had continued pain in the right shoulder. He is an avid weight lifter. He had a subacromial injection which alleviated some of his symptoms for a limited time, but the symptoms subsequently returned. His x-rays showed extensive ossification of the distal supraspinatus insertion and preformation of bone in the subacromial space and CA ligament. He also had joint space narrowing in the glenohumeral joint noted on x-ray with small osteophytes on the humeral head.  He is indicated for arthroscopic exam and removal of any loose bony fragments,  revision acromioplasty, and debridement of the glenohumeral joint. He understood the risks benefits and alternatives to the procedure and wished to go forward with surgery.  OPERATIVE FINDINGS: Examination under anesthesia: He had stiffness consistent with moderate arthritis. He can do about 150 forward flexion. In the abducted externally rotated position he can come to about 80. Abducted internal rotation was about 40. External rotation at the side was about 25. Diagnostic Arthroscopy:  Glenoid articular cartilage: Grade 4 diffuse articular cartilage loss. Humeral head articular cartilage: Grade 3 and 4 diffuse articular cartilage loss. Labrum: Diffuse degenerative tears. The long head biceps was torn involving greater than 50% of the tendon. Therefore a biceps tenotomy was carried out and subsequently an open subpectoral tenodesis. Loose bodies: None in the glenohumeral joint were subacromial space Synovitis: Moderate Articular sided rotator cuff: Intact Bursal sided rotator cuff: Intact Coracoacromial ligament: Absent, but there were extensive adhesions and scarring from previous surgery. Other findings: He was noted to have significant bone formation in the rotator cuff but the surface of the rotator cuff in the bursal and articular side or smooth. I did not feel that excavating the ossified rotator cuff would be in his best interest, as this would clearly cause full-thickness rotator cuff defects. I removed several large fragments of bone within the scarred coracoacromial ligament anteriorly and then resected a large recurrent anterior acromial hook.  DESCRIPTION OF PROCEDURE: The patient was identified in preoperative  holding area where I personally marked the operative site after  verifying site, side, and procedure with the patient. An  interscalene block was given by the attending anesthesiologist the holding area.  The patient was taken back to the operating room where general  anesthesia was induced without complication and was placed in the beach-chair position with the back  elevated about 60 degrees and all extremities and head and neck carefully padded and  positioned.   The right upper extremity was then prepped and  draped in a standard sterile fashion. The appropriate time-out  procedure was carried out. The patient did receive IV antibiotics  within 30 minutes of incision.   A small posterior portal incision was made and the arthroscope was introduced into the joint. An anterior portal was then established above the subscapularis using needle localization. Small cannula was placed anteriorly. Diagnostic arthroscopy was then carried out with findings as described above.  Collene Mares was used through the anterior portal to carry out extensive debridement of the degenerative labral tears, mostly superiorly, and the grade 3 and 4 articular cartilage chondromalacia of the glenoid and humeral head. It was determined that there was extensive tearing of the long head biceps tendon which was very likely a big portion of his pain. Therefore a biter was used through the anterior portal to carry out arthroscopic biceps tenotomy.  The arthroscope was then introduced into the subacromial space a standard lateral portal was established with needle localization. There was noted to be significant subacromial adhesions which were taken down with the combination of shaver and ArthriCare. The bursal surface of the rotator cuff was carefully probed and examined. Using a spinal needle I determined that there was several areas within the rotator cuff which were calcified, but no areas of soft calcific tendinitis material. Therefore I did not feel that excavating these large fragments of bone from the rotator cuff would be in his best interest, especially given the fact that the cuff was smooth and even.  Probing the anterior acromion, however, I did note that there were several large loose bony  fragments which I carefully excavated with the ArthroCare and removed with a grasper. There were 3 main fragments that were removed measuring about 1 x 1 cm, 10 x 15 mm, and 18 x 15 mm. These were documented with a digital camera. There was then left a large remaining anterior acromial downsloping hook which was resected with the 4 mm bur from the lateral portal. The shaver was used in the joint to remove excess bone dust.  The arthroscopic equipment was removed from the joint and the portals were closed with 3-0 nylon in an interrupted fashion.   Attention was then turned to the axilla where a approximately 3 cm incision was made in the dominant axillary fold. This was about 50% above and 50% below the palpable lower border of the pectoralis major. Dissection was carried out between the lower border of the pectoralis major and the short head of the biceps muscle belly. The anterior humerus was then exposed and the long head biceps was delivered out through the wound. The biceps was prepared using a #2 FiberWire fiber loop and the remaining portion of the biceps tendon was discarded after choosing the appropriate tension and length. A drill bit slightly smaller than the tendon was used in the distal bicipital groove to create an intramedullary hole and then a drill bit about 12 mm distal to that was used which was slightly larger than the suture passer needle. A crescent suture lasso was then used to pass the sutures from proximal to distal and then one  suture was brought around medial and lateral to the tendon. It was tensioned, dunking the tendon into the intramedullary canal and tied over the anterior portion of the tendon. The tension was felt to be appropriate. The wound was copiously irrigated with normal saline and subsequently closed in layers with 2-0 Vicryl in the deep dermal layer and Dermabond for skin closure.  Sterile dressings were then applied including Xeroform 4 x 4's ABDs and tape. The  patient was then allowed to awaken from general anesthesia, placed in a sling, transferred to the stretcher and taken to the recovery room in stable condition.   POSTOPERATIVE PLAN: The patient will be discharged home today and will followup in one week for suture removal and wound check.  He will have no active elbow flexion for 4 weeks and no heavy lifting for 6-8.

## 2011-05-18 NOTE — Anesthesia Postprocedure Evaluation (Signed)
  Anesthesia Post-op Note  Patient: [name]  Procedure(s) Performed: Procedure(s) (LRB): SHOULDER ARTHROSCOPY WITH SUBACROMIAL DECOMPRESSION (Right)  Patient Location: PACU  Anesthesia Type: GA combined with regional for post-op pain  Level of Consciousness: awake, alert  and oriented  Airway and Oxygen Therapy: Patient Spontanous Breathing  Post-op Pain: none  Post-op Assessment: Post-op Vital signs reviewed, Patient's Cardiovascular Status Stable, Respiratory Function Stable, Patent Airway, No signs of Nausea or vomiting, Adequate PO intake and Pain level controlled  Post-op Vital Signs: Reviewed and stable  Complications: No apparent anesthesia complications

## 2011-05-18 NOTE — Transfer of Care (Signed)
Immediate Anesthesia Transfer of Care Note  Patient: [name]  Procedure(s) Performed: Procedure(s) (LRB): SHOULDER ARTHROSCOPY WITH SUBACROMIAL DECOMPRESSION (Right)  Patient Location: PACU  Anesthesia Type: General and Regional  Level of Consciousness: awake, alert  and patient cooperative  Airway & Oxygen Therapy: Patient Spontanous Breathing and Patient connected to face mask oxygen  Post-op Assessment: Report given to PACU RN and Post -op Vital signs reviewed and stable  Post vital signs: Reviewed and stable  Complications: No apparent anesthesia complications

## 2011-06-15 ENCOUNTER — Other Ambulatory Visit: Payer: Self-pay | Admitting: Internal Medicine

## 2011-06-17 ENCOUNTER — Other Ambulatory Visit: Payer: Self-pay | Admitting: Internal Medicine

## 2011-07-13 ENCOUNTER — Other Ambulatory Visit: Payer: Self-pay | Admitting: Internal Medicine

## 2011-08-11 ENCOUNTER — Other Ambulatory Visit: Payer: Self-pay | Admitting: Physician Assistant

## 2011-08-12 ENCOUNTER — Other Ambulatory Visit: Payer: Self-pay | Admitting: Physician Assistant

## 2011-08-29 ENCOUNTER — Other Ambulatory Visit: Payer: Self-pay | Admitting: Physician Assistant

## 2011-09-08 ENCOUNTER — Encounter: Payer: Self-pay | Admitting: Internal Medicine

## 2011-10-11 ENCOUNTER — Ambulatory Visit (INDEPENDENT_AMBULATORY_CARE_PROVIDER_SITE_OTHER): Payer: 59 | Admitting: Internal Medicine

## 2011-10-11 ENCOUNTER — Ambulatory Visit: Payer: 59

## 2011-10-11 VITALS — BP 155/83 | HR 53 | Temp 98.3°F | Resp 16 | Ht 72.0 in | Wt 255.0 lb

## 2011-10-11 DIAGNOSIS — M542 Cervicalgia: Secondary | ICD-10-CM

## 2011-10-11 DIAGNOSIS — L709 Acne, unspecified: Secondary | ICD-10-CM | POA: Insufficient documentation

## 2011-10-11 DIAGNOSIS — M109 Gout, unspecified: Secondary | ICD-10-CM | POA: Insufficient documentation

## 2011-10-11 DIAGNOSIS — M25519 Pain in unspecified shoulder: Secondary | ICD-10-CM

## 2011-10-11 DIAGNOSIS — E785 Hyperlipidemia, unspecified: Secondary | ICD-10-CM

## 2011-10-11 MED ORDER — HYDROCODONE-ACETAMINOPHEN 10-325 MG PO TABS: 1.0000 | ORAL_TABLET | Freq: Four times a day (QID) | ORAL | Status: AC | PRN

## 2011-10-11 MED ORDER — KETOROLAC TROMETHAMINE 60 MG/2ML IM SOLN
60.0000 mg | Freq: Once | INTRAMUSCULAR | Status: AC
  Administered 2011-10-11: 60 mg via INTRAMUSCULAR

## 2011-10-11 NOTE — Progress Notes (Signed)
  Subjective:    Patient ID: [name], male    DOB: 1960/07/01, 51 y.o.   MRN: [id number]  [place] pain R/unable to sleep for the last 3 nights Pain worse at night but also pain with movement during the daytime getting worse Status post surgery on his right shoulder March 2013 Dr. Chandler-chronic problems in his shoulder for several years 2 injection since the surgery and still no pain relief This morning intense pain is present even without shoulder movement  Patient Active Problem List  Diagnosis  . Hyperlipidemia  . Gout  . Acne  Current outpatient prescriptions:allopurinol (ZYLOPRIM) 100 MG tablet, TAKE ONE TABLET BY MOUTH TWICE DAILY FOR GOUT  aspirin EC 81 MG tablet, Take 81 mg by mouth daily., Disp: , Rfl: ;  b complex vitamins tablet, Take 1 tablet by mouth daily., Disp: , Rfl: ;  cholecalciferol (VITAMIN D) 400 UNITS TABS, Take by mouth., Disp: , Rfl:  clindamycin (CLEOCIN T) 1 % lotion, Apply topically 2 (two) times daily., Disp: 60 mL, Rfl: 1;  colchicine 0.6 MG tablet, Take 0.6 mg by mouth daily., Disp: , Rfl: ;   fish oil-omega-3 fatty acids 1000 MG capsule, Take 2 g by mouth daily., Disp: , Rfl: ;  fluticasone (FLONASE) 50 MCG/ACT nasal spray, Place 2 sprays into the nose daily., Disp: , Rfl:  simvastatin (ZOCOR) 40 MG tablet, Take 1 tablet (40 mg total) by mouth at bedtime., Disp: 30 tablet, Rfl: 4;   cetirizine (ZYRTEC) 10 MG tablet, Take 10 mg by mouth daily., Disp: , Rfl: ;  HYDROcodone-acetaminophen (NORCO) 10-325 MG per tablet, Take 1 tablet by mouth every 6 (six) hours as needed for pain., Disp: 30 tablet, Rfl: 0      Review of SystemsNo fever or chills History of neck surgery 2003 for degenerative disc disease No numbness or tingling in extremities/no weakness of grip     Objective:   Physical Exam in pain obviously/Blood pressure mildly elevated and he has no diagnosis of hypertension Neck nontender to palpation but he has pain in his neck radiating to  the right with both extension and flexion Slight tenderness in the right trapezius Pain in the right shoulder with both passive and active abduction Pain with any abduction against resistance Anterior elevation to 90 ok No sensory or motor loss in the right hand  [facility] reading (PRIMARY) by  Dr. 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 Ave Filter on Wednesday

## 2011-10-12 NOTE — Progress Notes (Signed)
Faxed to Dr Ave Filter per Dr Merla Riches

## 2011-11-12 ENCOUNTER — Encounter (HOSPITAL_COMMUNITY): Payer: Self-pay | Admitting: Pharmacy Technician

## 2011-11-12 ENCOUNTER — Other Ambulatory Visit: Payer: Self-pay | Admitting: Orthopedic Surgery

## 2011-11-17 ENCOUNTER — Encounter (HOSPITAL_COMMUNITY): Payer: Self-pay

## 2011-11-17 ENCOUNTER — Encounter (HOSPITAL_COMMUNITY)
Admission: RE | Admit: 2011-11-17 | Discharge: 2011-11-17 | Disposition: A | Discharge status: Home / Self Care | Payer: 59 | Source: Ambulatory Visit | Attending: Orthopedic Surgery | Admitting: Orthopedic Surgery

## 2011-11-17 HISTORY — DX: Cervicalgia: M54.2

## 2011-11-17 HISTORY — DX: Other chronic pain: G89.29

## 2011-11-17 HISTORY — DX: Unspecified osteoarthritis, unspecified site: M19.90

## 2011-11-17 HISTORY — DX: Personal history of urinary calculi: Z87.442

## 2011-11-17 HISTORY — DX: Pain in unspecified joint: M25.50

## 2011-11-17 HISTORY — DX: Hyperlipidemia, unspecified: E78.5

## 2011-11-17 HISTORY — DX: Gout, unspecified: M10.9

## 2011-11-17 LAB — COMPREHENSIVE METABOLIC PANEL
Alkaline Phosphatase: 61 U/L (ref 39–117)
BUN: 13 mg/dL (ref 6–23)
Chloride: 104 mEq/L (ref 96–112)
Creatinine, Ser: 1.06 mg/dL (ref 0.50–1.35)
GFR calc Af Amer: >90 mL/min (ref >90)
GFR calc non Af Amer: 80 mL/min — ABNORMAL LOW (ref >90)
Glucose, Bld: 88 mg/dL (ref 70–99)
Potassium: 3.8 mEq/L (ref 3.5–5.1)
Total Bilirubin: 0.5 mg/dL (ref 0.3–1.2)

## 2011-11-17 LAB — CBC WITH DIFFERENTIAL/PLATELET
HCT: 45.5 % (ref 39.0–52.0)
Hemoglobin: 15.3 g/dL (ref 13.0–17.0)
Lymphs Abs: 2.4 10*3/uL (ref 0.7–4.0)
MCH: 29.9 pg (ref 26.0–34.0)
Monocytes Absolute: 0.3 10*3/uL (ref 0.1–1.0)
Monocytes Relative: 6 % (ref 3–12)
Neutro Abs: 2.7 10*3/uL (ref 1.7–7.7)
Neutrophils Relative %: 49 % (ref 43–77)
RBC: 5.12 MIL/uL (ref 4.22–5.81)

## 2011-11-17 LAB — ABO/RH: ABO/RH(D): O POS

## 2011-11-17 LAB — URINALYSIS, ROUTINE W REFLEX MICROSCOPIC
Bilirubin Urine: NEGATIVE
Glucose, UA: NEGATIVE mg/dL
Hgb urine dipstick: NEGATIVE
Nitrite: NEGATIVE
Specific Gravity, Urine: 1.025 (ref 1.005–1.030)
pH: 5 (ref 5.0–8.0)

## 2011-11-17 LAB — PROTIME-INR: Prothrombin Time: 14.6 seconds (ref 11.6–15.2)

## 2011-11-17 LAB — SURGICAL PCR SCREEN: Staphylococcus aureus: NEGATIVE

## 2011-11-17 LAB — APTT: aPTT: 31 seconds (ref 24–37)

## 2011-11-17 NOTE — Pre-Procedure Instructions (Signed)
[address] SCHNE[address] WARCHOL  11/17/2011   Your procedure is scheduled on:  Thurs, Sept 12 @ 7:30 AM  Report to [facility] at 5:30 AM.  Call this number if you have problems the morning of surgery: [phone]   Remember:   Do not eat food:After Midnight.  Take these medicines the morning of surgery with A SIP OF WATER: Allopurinol(Zyloprim) and Flonase(if needed)   Do not wear jewelry  Do not wear lotions, powders, or colognes  Men may shave face and neck.  Do not bring valuables to the hospital.  Contacts, dentures or bridgework may not be worn into surgery.  Leave suitcase in the car. After surgery it may be brought to your room.  For patients admitted to the hospital, checkout time is 11:00 AM the day of discharge.   Patients discharged the day of surgery will not be allowed to drive home.  Special Instructions: Incentive Spirometry - Practice and bring it with you on the day of surgery. and CHG Shower Use Special Wash: 1/2 bottle night before surgery and 1/2 bottle morning of surgery.   Please read over the following fact sheets that you were given: Pain Booklet, Coughing and Deep Breathing, Blood Transfusion Information, MRSA Information and Surgical Site Infection Prevention

## 2011-11-17 NOTE — Progress Notes (Addendum)
Pt doesn't have a cardiologist  Denies ever having an echo/stress test /heart cath  Dr[facility]Guest [facility]-  EKG to be requested from Dr.Guest  Denies CXR within the past year

## 2011-11-17 NOTE — Progress Notes (Signed)
Sleep study requested from [facility]

## 2011-11-18 ENCOUNTER — Telehealth: Payer: Self-pay

## 2011-11-18 ENCOUNTER — Encounter: Payer: Self-pay | Admitting: Family Medicine

## 2011-11-18 MED ORDER — CEFAZOLIN SODIUM-DEXTROSE 2-3 GM-% IV SOLR
2.0000 g | INTRAVENOUS | Status: AC
  Administered 2011-11-19: 2 g via INTRAVENOUS
  Filled 2011-11-18: qty 50

## 2011-11-18 NOTE — Telephone Encounter (Signed)
Spoke with [name]. EKG is under the media tab. She will look there and print it.

## 2011-11-18 NOTE — Telephone Encounter (Signed)
Amil Amen from [facility] would like for pt's ekg to be faxed to them because it is currently not in epic. WUJW#[phone] Fax: [phone]

## 2011-11-18 NOTE — Progress Notes (Addendum)
THE SLEEP STUDY FROM [facility]  IS RECEIVED AND PLACED ON THIS CHART.   Called Dr Ernestene Mention office [phone] requested ekg ... Be faxed to [facility] [phone]

## 2011-11-19 ENCOUNTER — Encounter (HOSPITAL_COMMUNITY): Payer: Self-pay | Admitting: Anesthesiology

## 2011-11-19 ENCOUNTER — Encounter (HOSPITAL_COMMUNITY): Payer: Self-pay | Admitting: *Deleted

## 2011-11-19 ENCOUNTER — Encounter (HOSPITAL_COMMUNITY)
Admission: RE | Disposition: A | Discharge status: Home / Self Care | Payer: Self-pay | Source: Ambulatory Visit | Attending: Orthopedic Surgery

## 2011-11-19 ENCOUNTER — Ambulatory Visit (HOSPITAL_COMMUNITY): Payer: 59 | Admitting: Anesthesiology

## 2011-11-19 ENCOUNTER — Ambulatory Visit (HOSPITAL_COMMUNITY): Payer: 59

## 2011-11-19 ENCOUNTER — Observation Stay (HOSPITAL_COMMUNITY)
Admission: RE | Admit: 2011-11-19 | Discharge: 2011-11-20 | DRG: 473 | Disposition: A | Discharge status: Home / Self Care | Payer: 59 | Source: Ambulatory Visit | Attending: Orthopedic Surgery | Admitting: Orthopedic Surgery

## 2011-11-19 DIAGNOSIS — E785 Hyperlipidemia, unspecified: Secondary | ICD-10-CM | POA: Insufficient documentation

## 2011-11-19 DIAGNOSIS — M5410 Radiculopathy, site unspecified: Secondary | ICD-10-CM

## 2011-11-19 DIAGNOSIS — Z981 Arthrodesis status: Secondary | ICD-10-CM | POA: Insufficient documentation

## 2011-11-19 DIAGNOSIS — M4802 Spinal stenosis, cervical region: Principal | ICD-10-CM | POA: Insufficient documentation

## 2011-11-19 DIAGNOSIS — Z01812 Encounter for preprocedural laboratory examination: Secondary | ICD-10-CM | POA: Insufficient documentation

## 2011-11-19 DIAGNOSIS — Z01818 Encounter for other preprocedural examination: Secondary | ICD-10-CM | POA: Insufficient documentation

## 2011-11-19 DIAGNOSIS — M1990 Unspecified osteoarthritis, unspecified site: Secondary | ICD-10-CM | POA: Insufficient documentation

## 2011-11-19 HISTORY — PX: ANTERIOR CERVICAL DECOMP/DISCECTOMY FUSION: SHX1161

## 2011-11-19 SURGERY — ANTERIOR CERVICAL DECOMPRESSION/DISCECTOMY FUSION 1 LEVEL
Anesthesia: General | Site: Spine Cervical | Laterality: Right | Wound class: Clean

## 2011-11-19 MED ORDER — POTASSIUM CHLORIDE IN NACL 20-0.9 MEQ/L-% IV SOLN
INTRAVENOUS | Status: DC
  Filled 2011-11-19 (×3): qty 1000

## 2011-11-19 MED ORDER — PROMETHAZINE HCL 25 MG/ML IJ SOLN: 6.2500 mg | INTRAMUSCULAR | Status: DC | PRN

## 2011-11-19 MED ORDER — MIDAZOLAM HCL 5 MG/5ML IJ SOLN
INTRAMUSCULAR | Status: DC | PRN
  Administered 2011-11-19 (×2): 1 mg via INTRAVENOUS

## 2011-11-19 MED ORDER — FENTANYL CITRATE 0.05 MG/ML IJ SOLN
INTRAMUSCULAR | Status: DC | PRN
  Administered 2011-11-19: 250 ug via INTRAVENOUS

## 2011-11-19 MED ORDER — ACETAMINOPHEN 10 MG/ML IV SOLN
INTRAVENOUS | Status: AC
  Filled 2011-11-19: qty 100

## 2011-11-19 MED ORDER — EPHEDRINE SULFATE 50 MG/ML IJ SOLN
INTRAMUSCULAR | Status: DC | PRN
  Administered 2011-11-19: 5 mg via INTRAVENOUS

## 2011-11-19 MED ORDER — PROPOFOL 10 MG/ML IV BOLUS
INTRAVENOUS | Status: DC | PRN
  Administered 2011-11-19: 150 mg via INTRAVENOUS

## 2011-11-19 MED ORDER — COLCHICINE 0.6 MG PO TABS
0.6000 mg | ORAL_TABLET | Freq: Every day | ORAL | Status: DC | PRN
  Filled 2011-11-19: qty 1

## 2011-11-19 MED ORDER — ALUM & MAG HYDROXIDE-SIMETH 200-200-20 MG/5ML PO SUSP: 30.0000 mL | Freq: Four times a day (QID) | ORAL | Status: DC | PRN

## 2011-11-19 MED ORDER — ACETAMINOPHEN 325 MG PO TABS: 650.0000 mg | ORAL_TABLET | ORAL | Status: DC | PRN

## 2011-11-19 MED ORDER — CEFAZOLIN SODIUM 1-5 GM-% IV SOLN
1.0000 g | Freq: Three times a day (TID) | INTRAVENOUS | Status: AC
  Administered 2011-11-19 (×2): 1 g via INTRAVENOUS
  Filled 2011-11-19 (×2): qty 50

## 2011-11-19 MED ORDER — ONDANSETRON HCL 4 MG/2ML IJ SOLN
INTRAMUSCULAR | Status: DC | PRN
  Administered 2011-11-19: 4 mg via INTRAVENOUS

## 2011-11-19 MED ORDER — OXYCODONE-ACETAMINOPHEN 5-325 MG PO TABS
1.0000 | ORAL_TABLET | ORAL | Status: DC | PRN
  Administered 2011-11-19 – 2011-11-20 (×4): 2 via ORAL
  Filled 2011-11-19 (×3): qty 2

## 2011-11-19 MED ORDER — BUPIVACAINE-EPINEPHRINE PF 0.25-1:200000 % IJ SOLN
INTRAMUSCULAR | Status: AC
  Filled 2011-11-19: qty 30

## 2011-11-19 MED ORDER — 0.9 % SODIUM CHLORIDE (POUR BTL) OPTIME
TOPICAL | Status: DC | PRN
  Administered 2011-11-19: 1000 mL

## 2011-11-19 MED ORDER — LACTATED RINGERS IV SOLN
INTRAVENOUS | Status: DC | PRN
  Administered 2011-11-19 (×2): via INTRAVENOUS

## 2011-11-19 MED ORDER — SODIUM CHLORIDE 0.9 % IJ SOLN: 3.0000 mL | INTRAMUSCULAR | Status: DC | PRN

## 2011-11-19 MED ORDER — NEOSTIGMINE METHYLSULFATE 1 MG/ML IJ SOLN
INTRAMUSCULAR | Status: DC | PRN
  Administered 2011-11-19: 5 mg via INTRAVENOUS

## 2011-11-19 MED ORDER — ALLOPURINOL 100 MG PO TABS
100.0000 mg | ORAL_TABLET | Freq: Two times a day (BID) | ORAL | Status: DC
  Administered 2011-11-19: 100 mg via ORAL
  Filled 2011-11-19 (×3): qty 1

## 2011-11-19 MED ORDER — POVIDONE-IODINE 7.5 % EX SOLN
Freq: Once | CUTANEOUS | Status: DC
  Filled 2011-11-19: qty 118

## 2011-11-19 MED ORDER — THROMBIN 20000 UNITS EX SOLR
CUTANEOUS | Status: AC
  Filled 2011-11-19: qty 20000

## 2011-11-19 MED ORDER — OXYCODONE-ACETAMINOPHEN 5-325 MG PO TABS: 1.0000 | ORAL_TABLET | ORAL | Status: DC | PRN

## 2011-11-19 MED ORDER — MORPHINE SULFATE 2 MG/ML IJ SOLN: 2.0000 mg | INTRAMUSCULAR | Status: DC | PRN

## 2011-11-19 MED ORDER — INFLUENZA VIRUS VACC SPLIT PF IM SUSP
0.5000 mL | INTRAMUSCULAR | Status: AC
Start: 2011-11-20 — End: 2011-11-20
  Administered 2011-11-20: 0.5 mL via INTRAMUSCULAR
  Filled 2011-11-19: qty 0.5

## 2011-11-19 MED ORDER — DIAZEPAM 5 MG PO TABS
5.0000 mg | ORAL_TABLET | ORAL | Status: DC | PRN
  Administered 2011-11-19 – 2011-11-20 (×2): 5 mg via ORAL
  Filled 2011-11-19: qty 1

## 2011-11-19 MED ORDER — PHENOL 1.4 % MT LIQD
1.0000 | OROMUCOSAL | Status: DC | PRN
  Administered 2011-11-19: 1 via OROMUCOSAL
  Filled 2011-11-19: qty 177

## 2011-11-19 MED ORDER — DIAZEPAM 5 MG PO TABS: 5.0000 mg | ORAL_TABLET | Freq: Four times a day (QID) | ORAL | Status: DC | PRN

## 2011-11-19 MED ORDER — ACETAMINOPHEN 10 MG/ML IV SOLN
INTRAVENOUS | Status: DC | PRN
  Administered 2011-11-19: 1000 mg via INTRAVENOUS

## 2011-11-19 MED ORDER — OXYCODONE-ACETAMINOPHEN 5-325 MG PO TABS
ORAL_TABLET | ORAL | Status: AC
  Filled 2011-11-19: qty 2

## 2011-11-19 MED ORDER — MEPERIDINE HCL 25 MG/ML IJ SOLN: 6.2500 mg | INTRAMUSCULAR | Status: DC | PRN

## 2011-11-19 MED ORDER — SIMVASTATIN 40 MG PO TABS
40.0000 mg | ORAL_TABLET | Freq: Every day | ORAL | Status: DC
  Administered 2011-11-19: 40 mg via ORAL
  Filled 2011-11-19 (×2): qty 1

## 2011-11-19 MED ORDER — GLYCOPYRROLATE 0.2 MG/ML IJ SOLN
INTRAMUSCULAR | Status: DC | PRN
  Administered 2011-11-19: .8 mg via INTRAVENOUS

## 2011-11-19 MED ORDER — THROMBIN 20000 UNITS EX SOLR
CUTANEOUS | Status: DC | PRN
  Administered 2011-11-19: 09:00:00 via TOPICAL

## 2011-11-19 MED ORDER — DOCUSATE SODIUM 100 MG PO CAPS
100.0000 mg | ORAL_CAPSULE | Freq: Two times a day (BID) | ORAL | Status: DC
  Administered 2011-11-19 – 2011-11-20 (×3): 100 mg via ORAL
  Filled 2011-11-19 (×2): qty 1

## 2011-11-19 MED ORDER — SENNA 8.6 MG PO TABS
1.0000 | ORAL_TABLET | Freq: Two times a day (BID) | ORAL | Status: DC
  Administered 2011-11-19 – 2011-11-20 (×2): 8.6 mg via ORAL
  Filled 2011-11-19 (×3): qty 1

## 2011-11-19 MED ORDER — ACETAMINOPHEN 650 MG RE SUPP: 650.0000 mg | RECTAL | Status: DC | PRN

## 2011-11-19 MED ORDER — SODIUM CHLORIDE 0.9 % IJ SOLN
3.0000 mL | Freq: Two times a day (BID) | INTRAMUSCULAR | Status: DC
  Administered 2011-11-19 (×2): 3 mL via INTRAVENOUS

## 2011-11-19 MED ORDER — ZOLPIDEM TARTRATE 5 MG PO TABS: 5.0000 mg | ORAL_TABLET | Freq: Every evening | ORAL | Status: DC | PRN

## 2011-11-19 MED ORDER — LIDOCAINE HCL (CARDIAC) 20 MG/ML IV SOLN
INTRAVENOUS | Status: DC | PRN
  Administered 2011-11-19: 30 mg via INTRAVENOUS

## 2011-11-19 MED ORDER — MENTHOL 3 MG MT LOZG
1.0000 | LOZENGE | OROMUCOSAL | Status: DC | PRN
  Filled 2011-11-19: qty 9

## 2011-11-19 MED ORDER — ROCURONIUM BROMIDE 100 MG/10ML IV SOLN
INTRAVENOUS | Status: DC | PRN
  Administered 2011-11-19 (×3): 10 mg via INTRAVENOUS
  Administered 2011-11-19: 50 mg via INTRAVENOUS

## 2011-11-19 MED ORDER — BUPIVACAINE-EPINEPHRINE 0.25% -1:200000 IJ SOLN
INTRAMUSCULAR | Status: DC | PRN
  Administered 2011-11-19: 3 mL

## 2011-11-19 MED ORDER — HYDROMORPHONE HCL PF 1 MG/ML IJ SOLN
INTRAMUSCULAR | Status: AC
  Filled 2011-11-19: qty 1

## 2011-11-19 MED ORDER — DEXTROSE 5 % IV SOLN
INTRAVENOUS | Status: DC | PRN
  Administered 2011-11-19: 08:00:00 via INTRAVENOUS

## 2011-11-19 MED ORDER — ONDANSETRON HCL 4 MG/2ML IJ SOLN: 4.0000 mg | INTRAMUSCULAR | Status: DC | PRN

## 2011-11-19 MED ORDER — MIDAZOLAM HCL 2 MG/2ML IJ SOLN: 0.5000 mg | Freq: Once | INTRAMUSCULAR | Status: DC | PRN

## 2011-11-19 MED ORDER — HYDROMORPHONE HCL PF 1 MG/ML IJ SOLN
0.2500 mg | INTRAMUSCULAR | Status: DC | PRN
  Administered 2011-11-19 (×4): 0.5 mg via INTRAVENOUS

## 2011-11-19 SURGICAL SUPPLY — 66 items
BENZOIN TINCTURE PRP APPL 2/3 (GAUZE/BANDAGES/DRESSINGS) ×2 IMPLANT
BIT DRILL NEURO 2X3.1 SFT TUCH (MISCELLANEOUS) ×1 IMPLANT
BLADE SURG 15 STRL LF DISP TIS (BLADE) ×1 IMPLANT
BLADE SURG 15 STRL SS (BLADE) ×1
BLADE SURG ROTATE 9660 (MISCELLANEOUS) ×2 IMPLANT
BUR MATCHSTICK NEURO 3.0 LAGG (BURR) ×2 IMPLANT
CARTRIDGE OIL MAESTRO DRILL (MISCELLANEOUS) ×1 IMPLANT
CLOTH BEACON ORANGE TIMEOUT ST (SAFETY) ×2 IMPLANT
CLSR STERI-STRIP ANTIMIC 1/2X4 (GAUZE/BANDAGES/DRESSINGS) ×2 IMPLANT
COLLAR CERV LO CONTOUR FIRM DE (SOFTGOODS) IMPLANT
CORDS BIPOLAR (ELECTRODE) ×2 IMPLANT
COVER SURGICAL LIGHT HANDLE (MISCELLANEOUS) ×2 IMPLANT
CRADLE DONUT ADULT HEAD (MISCELLANEOUS) ×2 IMPLANT
DIFFUSER DRILL AIR PNEUMATIC (MISCELLANEOUS) ×2 IMPLANT
DRAIN JACKSON RD 7FR 3/32 (WOUND CARE) IMPLANT
DRAPE C-ARM 42X72 X-RAY (DRAPES) ×2 IMPLANT
DRAPE POUCH INSTRU U-SHP 10X18 (DRAPES) ×2 IMPLANT
DRAPE SURG 17X23 STRL (DRAPES) ×6 IMPLANT
DRILL NEURO 2X3.1 SOFT TOUCH (MISCELLANEOUS) ×2
DURAPREP 26ML APPLICATOR (WOUND CARE) ×2 IMPLANT
ELECT COATED BLADE 2.86 ST (ELECTRODE) ×2 IMPLANT
ELECT REM PT RETURN 9FT ADLT (ELECTROSURGICAL) ×2
ELECTRODE REM PT RTRN 9FT ADLT (ELECTROSURGICAL) ×1 IMPLANT
EVACUATOR SILICONE 100CC (DRAIN) IMPLANT
GAUZE SPONGE 4X4 16PLY XRAY LF (GAUZE/BANDAGES/DRESSINGS) ×2 IMPLANT
GLOVE BIO SURGEON STRL SZ8 (GLOVE) ×2 IMPLANT
GLOVE BIOGEL PI IND STRL 8 (GLOVE) ×1 IMPLANT
GLOVE BIOGEL PI INDICATOR 8 (GLOVE) ×1
GOWN SRG XL XLNG 56XLVL 4 (GOWN DISPOSABLE) ×1 IMPLANT
GOWN STRL NON-REIN LRG LVL3 (GOWN DISPOSABLE) ×2 IMPLANT
GOWN STRL NON-REIN XL XLG LVL4 (GOWN DISPOSABLE) ×1
IV CATH 14GX2 1/4 (CATHETERS) ×2 IMPLANT
KIT BASIN OR (CUSTOM PROCEDURE TRAY) ×2 IMPLANT
KIT ROOM TURNOVER OR (KITS) ×2 IMPLANT
MANIFOLD NEPTUNE II (INSTRUMENTS) ×2 IMPLANT
NEEDLE 27GAX1X1/2 (NEEDLE) ×2 IMPLANT
NEEDLE SPNL 20GX3.5 QUINCKE YW (NEEDLE) ×2 IMPLANT
NS IRRIG 1000ML POUR BTL (IV SOLUTION) ×2 IMPLANT
OIL CARTRIDGE MAESTRO DRILL (MISCELLANEOUS) ×2
PACK ORTHO CERVICAL (CUSTOM PROCEDURE TRAY) ×2 IMPLANT
PAD ARMBOARD 7.5X6 YLW CONV (MISCELLANEOUS) ×4 IMPLANT
PATTIES SURGICAL .5 X.5 (GAUZE/BANDAGES/DRESSINGS) IMPLANT
PATTIES SURGICAL .5 X1 (DISPOSABLE) IMPLANT
PIN DISTRACTION 14 (PIN) ×4 IMPLANT
PUTTY BONE DBX 2.5 MIS (Bone Implant) ×2 IMPLANT
SCREW SELF DRILLING 14MM (Screw) ×4 IMPLANT
SPONGE GAUZE 4X4 12PLY (GAUZE/BANDAGES/DRESSINGS) ×2 IMPLANT
SPONGE INTESTINAL PEANUT (DISPOSABLE) ×2 IMPLANT
SPONGE SURGIFOAM ABS GEL 100 (HEMOSTASIS) IMPLANT
STRIP CLOSURE SKIN 1/2X4 (GAUZE/BANDAGES/DRESSINGS) ×2 IMPLANT
SURGIFLO TRUKIT (HEMOSTASIS) IMPLANT
SUT MNCRL AB 4-0 PS2 18 (SUTURE) IMPLANT
SUT SILK 4 0 (SUTURE)
SUT SILK 4-0 18XBRD TIE 12 (SUTURE) IMPLANT
SUT VIC AB 1 CT1 27 (SUTURE) ×1
SUT VIC AB 1 CT1 27XBRD ANBCTR (SUTURE) ×1 IMPLANT
SUT VIC AB 2-0 CT2 18 VCP726D (SUTURE) ×2 IMPLANT
SYR BULB IRRIGATION 50ML (SYRINGE) ×2 IMPLANT
SYR CONTROL 10ML LL (SYRINGE) ×4 IMPLANT
Synthes spine, Zero-P VA implant 8mm height. Paral ×2 IMPLANT
TAPE CLOTH 4X10 WHT NS (GAUZE/BANDAGES/DRESSINGS) ×2 IMPLANT
TAPE UMBILICAL COTTON 1/8X30 (MISCELLANEOUS) ×2 IMPLANT
TOWEL OR 17X24 6PK STRL BLUE (TOWEL DISPOSABLE) ×2 IMPLANT
TOWEL OR 17X26 10 PK STRL BLUE (TOWEL DISPOSABLE) ×2 IMPLANT
WATER STERILE IRR 1000ML POUR (IV SOLUTION) ×2 IMPLANT
YANKAUER SUCT BULB TIP NO VENT (SUCTIONS) ×2 IMPLANT

## 2011-11-19 NOTE — H&P (Signed)
PREOPERATIVE H&P  Chief Complaint: arm pain  HPI: [name] is a 52 y.o. male who presents with arm pain  Past Medical History  Diagnosis Date  . Hyperlipidemia     takes Simvastatin daily  . Arthritis   . Joint pain   . Chronic neck pain   . History of kidney stones     last time 66yrs ago  . Gout     takes Allopurinol daily and Colchicine daily  . Sleep apnea     requested sleep study from [facility]   Past Surgical History  Procedure Date  . Shoulder surgery 45yrs ago and then 2013    x 2  . Carpal tunnel release 2008    right  . Cervical fusion 2003  . Knee arthroscopy 15+yrs ago    right   . Vasectomy 11yrs ago  . Shoulder surgery 14yrs ago    left   . Colonosocpy   . Circumcision 16yrs   History   Social History  . Marital Status: Married    Spouse Name: N/A    Number of Children: N/A  . Years of Education: N/A   Social History Main Topics  . Smoking status: Never Smoker   . Smokeless tobacco: None  . Alcohol Use: No  . Drug Use: No  . Sexually Active: Yes   Other Topics Concern  . None   Social History Narrative  . None   History reviewed. No pertinent family history. No Known Allergies Prior to Admission medications   Medication Sig Start Date End Date Taking? Authorizing Provider  allopurinol (ZYLOPRIM) 100 MG tablet Take 100 mg by mouth 2 (two) times daily.   Yes Historical Provider, MD  aspirin EC 81 MG tablet Take 81 mg by mouth daily.   Yes Historical Provider, MD  b complex vitamins tablet Take 1 tablet by mouth daily.   Yes Historical Provider, MD  cholecalciferol (VITAMIN D) 400 UNITS TABS Take 400 Units by mouth daily.    Yes Historical Provider, MD  clindamycin (CLEOCIN T) 1 % lotion Apply 1 application topically 2 (two) times daily as needed. For affected area   Yes Historical Provider, MD  fluticasone (FLONASE) 50 MCG/ACT nasal spray Place 2 sprays into the nose daily as needed. For allergies   Yes Historical Provider, MD    Omega-3 Fatty Acids (FISH OIL) 1200 MG CAPS Take 1,200 mg by mouth daily.   Yes Historical Provider, MD  simvastatin (ZOCOR) 40 MG tablet Take 40 mg by mouth at bedtime. 08/12/11  Yes Marzella Schlein McClung, PA-C  colchicine 0.6 MG tablet Take 0.6 mg by mouth daily as needed. For gout attacks    Historical Provider, MD     All other systems have been reviewed and were otherwise negative with the exception of those mentioned in the HPI and as above.  Physical Exam: Filed Vitals:   11/19/11 0558  BP: 137/86  Pulse: 64  Temp: 99 F (37.2 C)  Resp: 18    General: Alert, no acute distress Cardiovascular: No pedal edema Respiratory: No cyanosis, no use of accessory musculature Skin: No lesions in the area of chief complaint Neurologic: Sensation intact distally Psychiatric: Patient is competent for consent with normal mood and affect Lymphatic: No axillary or cervical lymphadenopathy   Assessment/Plan: Neck pain Plan for Procedure(s): ANTERIOR CERVICAL DECOMPRESSION/DISCECTOMY FUSION C4/5   Emilee Hero, MD 11/19/2011 6:41 AM

## 2011-11-19 NOTE — Anesthesia Procedure Notes (Signed)
Procedure Name: Intubation Date/Time: 11/19/2011 8:03 AM Performed by: Julianne Rice K Pre-anesthesia Checklist: Emergency Drugs available, Patient identified, Timeout performed, Suction available and Patient being monitored Patient Re-evaluated:Patient Re-evaluated prior to inductionOxygen Delivery Method: Circle system utilized Preoxygenation: Pre-oxygenation with 100% oxygen Intubation Type: IV induction Ventilation: Mask ventilation without difficulty Tube type: Oral Number of attempts: 1 Airway Equipment and Method: Stylet and Video-laryngoscopy Placement Confirmation: ETT inserted through vocal cords under direct vision,  breath sounds checked- equal and bilateral and positive ETCO2 Secured at: 24 cm Tube secured with: Tape Dental Injury: Teeth and Oropharynx as per pre-operative assessment

## 2011-11-19 NOTE — Anesthesia Postprocedure Evaluation (Signed)
  Anesthesia Post-op Note  Patient: [name]  Procedure(s) Performed: Procedure(s) (LRB) with comments: ANTERIOR CERVICAL DECOMPRESSION/DISCECTOMY FUSION 1 LEVEL (Right) - Anterior cervical decompression fusion, cervical 4-5 with instrumentation and allograft.  Patient Location: PACU  Anesthesia Type: General  Level of Consciousness: awake, alert , oriented and patient cooperative  Airway and Oxygen Therapy: Patient Spontanous Breathing and Patient connected to nasal cannula oxygen  Post-op Pain: mild  Post-op Assessment: Post-op Vital signs reviewed, Patient's Cardiovascular Status Stable, Respiratory Function Stable, Patent Airway, No signs of Nausea or vomiting and Pain level controlled  Post-op Vital Signs: Reviewed and stable  Complications: No apparent anesthesia complications

## 2011-11-19 NOTE — Progress Notes (Signed)
Scanner broken IT has been called

## 2011-11-19 NOTE — Plan of Care (Signed)
Problem: Consults Goal: Diagnosis - Spinal Surgery Outcome: Completed/Met Date Met:  11/19/11 Cervical Spine Fusion     

## 2011-11-19 NOTE — Transfer of Care (Signed)
Immediate Anesthesia Transfer of Care Note  Patient: [name]  Procedure(s) Performed: Procedure(s) (LRB) with comments: ANTERIOR CERVICAL DECOMPRESSION/DISCECTOMY FUSION 1 LEVEL (Right) - Anterior cervical decompression fusion, cervical 4-5 with instrumentation and allograft.  Patient Location: PACU  Anesthesia Type: General  Level of Consciousness: awake, alert , oriented and patient cooperative  Airway & Oxygen Therapy: Patient Spontanous Breathing and Patient connected to nasal cannula oxygen  Post-op Assessment: Report given to PACU RN, Post -op Vital signs reviewed and stable and Patient moving all extremities X 4  Post vital signs: Reviewed and stable  Complications: No apparent anesthesia complications

## 2011-11-19 NOTE — Preoperative (Signed)
Beta Blockers   Reason not to administer Beta Blockers:Not Applicable 

## 2011-11-19 NOTE — Anesthesia Preprocedure Evaluation (Addendum)
Anesthesia Evaluation  Patient identified by MRN, date of birth, ID band Patient awake    Reviewed: Allergy & Precautions, H&P , NPO status , Patient's Chart, lab work & pertinent test results  History of Anesthesia Complications Negative for: history of anesthetic complications  Airway Mallampati: III TM Distance: >3 FB Neck ROM: Full    Dental No notable dental hx. (+) Teeth Intact and Dental Advisory Given   Pulmonary sleep apnea and Continuous Positive Airway Pressure Ventilation ,  breath sounds clear to auscultation  Pulmonary exam normal       Cardiovascular negative cardio ROS  Rhythm:Regular Rate:Normal     Neuro/Psych negative psych ROS   GI/Hepatic negative GI ROS, Neg liver ROS,   Endo/Other  negative endocrine ROSMorbid obesity  Renal/GU negative Renal ROS     Musculoskeletal  (+) Arthritis -, Osteoarthritis,    Abdominal (+) + obese,   Peds  Hematology negative hematology ROS (+)   Anesthesia Other Findings   Reproductive/Obstetrics                        Anesthesia Physical Anesthesia Plan  ASA: III  Anesthesia Plan: General   Post-op Pain Management:    Induction: Intravenous  Airway Management Planned: Oral ETT and Video Laryngoscope Planned  Additional Equipment:   Intra-op Plan:   Post-operative Plan: Extubation in OR  Informed Consent: I have reviewed the patients History and Physical, chart, labs and discussed the procedure including the risks, benefits and alternatives for the proposed anesthesia with the patient or authorized representative who has indicated his/her understanding and acceptance.   Dental advisory given  Plan Discussed with: Anesthesiologist, Surgeon and CRNA  Anesthesia Plan Comments: (Plan routine monitors, GETA)     Anesthesia Quick Evaluation

## 2011-11-20 NOTE — Progress Notes (Signed)
Right arm pain resolved. + right shoulder pain.  BP 111/73  Pulse 67  Temp 99.1 F (37.3 C) (Oral)  Resp 16  SpO2 93%  NVI Dressing CDI  POD #1 after C4/5 ACDF  - philly collar to bedside for showering - d/c home with f/u in 2 weeks

## 2011-11-20 NOTE — Progress Notes (Signed)
Orthopedic Tech Progress Note Patient Details:  [name] May 17, 1960 [id number]  Ortho Devices Type of Ortho Device: Philadelphia cervical collar Ortho Device/Splint Interventions: Application   Cammer, Mickie Bail 11/20/2011, 9:59 AM

## 2011-11-20 NOTE — Op Note (Signed)
[name], HUMPHREY NO.:  [id number]  MEDICAL RECORD NO.:  [id number]  LOCATION:  3526                         FACILITY:  [facility]  PHYSICIAN:  Estill Bamberg, MD      DATE OF BIRTH:  10/22/1960  DATE OF PROCEDURE:  11/19/2011 DATE OF DISCHARGE:                              OPERATIVE REPORT   PREOPERATIVE DIAGNOSIS:  Right-sided C5 radiculopathy resulting in right arm pain.  In addition, significant profound weakness of the right shoulder and biceps.  POSTOPERATIVE DIAGNOSIS:  Right-sided C5 radiculopathy resulting in right arm pain.  In addition, significant profound weakness of the right shoulder and biceps.  PROCEDURES: 1. C4-5 anterior cervical decompression and fusion. 2. Placement of anterior instrumentation, C4-5. 3. Insertion of interbody device x1 (0-P interbody device, 8 mm in     height). 4. Use of local autograft. 5. Use of morselized allograft (DBX next). 6. Intraoperative use of fluoroscopy.  SURGEON:  Estill Bamberg, MD  ASSISTANT:  None.  ANESTHESIA:  General endotracheal anesthesia.  COMPLICATIONS:  None.  DISPOSITION:  Stable.  ESTIMATED BLOOD LOSS:  Minimal.  INDICATIONS FOR PROCEDURE:  Briefly, Mr. Lonon is an extremely pleasant 51 year old male who presented to me initially on November 12, 2011, with severe pain in his right arm.  Of note, the patient is status post an ACDF from C5-C7 approximately 10-12 years ago.  He did however go on to have significant weakness and pain in the right arm.  MRI did clearly reveal rather profound foraminal stenosis on the right side at the C4-5 level causing compression of the exiting C5 nerve.  Given the patient's weakness and pain, we did make a decision to go forward with an anterior cervical decompression and fusion.  The patient fully understood the risks and limitations of the procedure as outlined in my preoperative note.  OPERATIVE DETAILS:  On November 19, 2011, the patient was  brought to surgery and general endotracheal anesthesia was administered.  The patient was placed supine on a well-padded hospital bed.  The neck was placed in a gentle degree of extension.  The shoulders were taped to the inferior aspect of the bed and the arms were secured to the patient's sides.  All bony prominences were meticulously padded.  Antibiotics were given and a time-out procedure was performed.  Of note, the patient previously had a left-sided incision and I did obtain an ENT evaluation to ensure normal bilateral vocal cord mobility.  The vocal cords were normal and therefore a right-sided approach was used.  The neck was then prepped and draped in the usual sterile fashion and I then made a transverse incision on the right side overlying the C4-5 interspace. The platysma was sharply incised in the plane between the sternocleidomastoid muscle and the carotid artery was identified and developed.  The anterior cervical spine was readily noted.  The previously placed anterior cervical plate was readily noted as well.  I then subperiosteally exposed the C4-5 intervertebral space.  There was rather significant scar tissue noted about the prevertebral fascia. This was bluntly swept away using peanuts.  I then placed a self- retaining Shadow-Line retractor centered over the C4-5 interspace.  I then used a 15-blade knife to perform an annulotomy anteriorly.  I did use a series of curettes and Kerrison punches to perform a thorough diskectomy from anterior to posterior.  The posterior longitudinal ligament was encountered.  At this point, I did place Caspar pins into the C4 and C5 vertebral bodies and gentle distraction was applied.  This did help me gain access to the posterior interspace.  Of particular note, there was abundant scar tissue noted, it was extremely difficult to develop a plane between the posterior longitudinal ligament and the dura.  This portion of the procedure did  take rather significant portion of time.  I was however able to safely identify a plane and I did at this point continue the decompression along the right neural foramen. Of note, there was a significant disk osteophyte complex located behind the C4 vertebral body and in the right side of the spinal canal.  I therefore did again take meticulous amount of time and used meticulous care in removing this disk fragment, as it was very much noted to be adherent to the vertebral body above.  Again, however, I was ultimately able to now remove the protrusion.  At this point, I did use a nerve hook and I was easily able to pass a nerve hook along the dura and out the right neural foramen.  This did confirm a thorough and adequate decompression of the exiting C5 nerve.  Again, this decompressive aspect of the procedure was extremely meticulous and normally takes approximately 30-45 minutes to perform where as in this case it did take approximately 2 hours to perform, given the extensive adhesions and the rather complex protrusion identified above.  I then turned my attention towards evening out the endplates.  The upper endplate was noted to be very concave and I did use a high-speed bur in addition to a series of Kerrison punches to even out the endplate above, so as to ensure an adequate fit of the interbody implant.  Autograft obtained during this portion of the procedure was placed on the back table for later use.  I then placed a series of trials and did feel that an 8-mm parallel trial will be the most appropriate fit.  This was packed with autograft obtained previously in addition to the DBX mix and this was tamped into position in the usual fashion.  I then placed a vertebral body screw into the C4 and C5 vertebral bodies using intraoperative fluoroscopy.  I was very pleased with the press fit of the screws.  I was very happy with the final press fit of the implant as well.  At this point,  I copiously irrigated the wound and explored the wound for any undue bleeding and there was none.  The distraction pins were removed and bone wax was placed in its place.  I then closed the platysma using 2-0 Vicryl.  The skin was then closed using 3-0 Monocryl.  Benzoin and Steri- Strips were then applied followed by sterile dressing.  Hard collar was placed.  The patient was then awoken from general endotracheal anesthesia and transferred to recovery in stable condition.     Estill Bamberg, MD     MD/MEDQ  D:  11/19/2011  T:  11/20/2011  Job:  [id number]  cc:   Jonita Albee, M.D.

## 2011-11-21 NOTE — Discharge Summary (Signed)
[name], [name] NO.:  [id number]  MEDICAL RECORD NO.:  [id number]  LOCATION:  3526                         FACILITY:  [facility]  PHYSICIAN:  Estill Bamberg, MD      DATE OF BIRTH:  01/09/1961  DATE OF ADMISSION:  11/19/2011 DATE OF DISCHARGE:  11/20/2011                              DISCHARGE SUMMARY   ADMISSION DIAGNOSES:  Right-sided C5 radiculopathy resulting in right- sided arm pain and weakness and the patient is status post a previous C5- C7 anterior cervical discectomy and fusion.  DISCHARGE DIAGNOSES:  Right-sided C5 radiculopathy resulting in right- sided arm pain and weakness and the patient is status post a previous C5- C7 anterior cervical discectomy and fusion.  ADMITTING PHYSICIAN:  Estill Bamberg, MD  ADMISSION HISTORY:  Briefly, [name] is a very pleasant 51 year old male who initially presented to me on November 12, 2011, with severe pain and weakness in his right arm.  Of note, the patient is status post a shoulder procedure on the right side, which did not appear to alleviate his pain.  An EMG did reveal right-sided C5 radiculopathy and an MRI did reveal neuroforaminal stenosis on the right side at C4-5, at the level of adjacent to his previous fusion.  During the patient's substantial pain and weakness, we did have a discussion regarding going forward with an adjacent level ACDF at the C4-5 level.  The patient was therefore admitted on November 19, 2011, for the procedure reflected above.  The patient did understand that any pathology related to his right shoulder may not improve with surgery.  HOSPITAL COURSE:  On November 19, 2011, the patient was brought to Surgery and underwent a C4-5 ACDF as reflected above.  The patient tolerated the procedure well, was transferred to recovery in stable condition.  The patient was admitted overnight for observation and was evaluated by me on the morning of postoperative day #1.  The  patient's pain to his right arm was resolved.  He did have some residual shoulder discomfort, which I did inspect was secondary to intrinsic pathology related to the shoulder itself.  The patient was tolerating the diet well, was uneventfully discharged home on the morning of postoperative day #1.  DISCHARGE INSTRUCTIONS:  The patient will maintain his Aspen cervical collar at all times.  He will follow up in my office in approximately 2 weeks after his procedure.  He will take Percocet for pain and Valium for spasms.  He will contact my office should any concerns develop.     Estill Bamberg, MD     MD/MEDQ  D:  11/20/2011  T:  11/21/2011  Job:  [id number]

## 2011-11-23 ENCOUNTER — Encounter (HOSPITAL_COMMUNITY): Payer: Self-pay | Admitting: Orthopedic Surgery

## 2011-12-04 ENCOUNTER — Other Ambulatory Visit: Payer: Self-pay | Admitting: Physician Assistant

## 2012-02-10 ENCOUNTER — Other Ambulatory Visit: Payer: Self-pay | Admitting: Physician Assistant

## 2012-05-16 ENCOUNTER — Other Ambulatory Visit: Payer: Self-pay | Admitting: Physician Assistant

## 2012-05-16 NOTE — Telephone Encounter (Signed)
Needs office visit.

## 2012-06-07 DIAGNOSIS — Z0271 Encounter for disability determination: Secondary | ICD-10-CM

## 2012-06-13 ENCOUNTER — Encounter: Payer: 59 | Admitting: Internal Medicine

## 2012-07-14 ENCOUNTER — Ambulatory Visit (INDEPENDENT_AMBULATORY_CARE_PROVIDER_SITE_OTHER): Payer: Federal, State, Local not specified - PPO | Admitting: Emergency Medicine

## 2012-07-14 VITALS — BP 138/96 | HR 82 | Temp 98.6°F | Resp 18 | Ht 70.5 in | Wt 249.2 lb

## 2012-07-14 DIAGNOSIS — Z8739 Personal history of other diseases of the musculoskeletal system and connective tissue: Secondary | ICD-10-CM

## 2012-07-14 DIAGNOSIS — Z0000 Encounter for general adult medical examination without abnormal findings: Secondary | ICD-10-CM

## 2012-07-14 LAB — POCT UA - MICROSCOPIC ONLY: Casts, Ur, LPF, POC: NEGATIVE

## 2012-07-14 LAB — POCT URINALYSIS DIPSTICK
Glucose, UA: NEGATIVE
Spec Grav, UA: 1.025
Urobilinogen, UA: 0.2

## 2012-07-14 LAB — GLUCOSE, POCT (MANUAL RESULT ENTRY): POC Glucose: 82 mg/dl (ref 70–99)

## 2012-07-14 NOTE — Patient Instructions (Addendum)
You will get a call about your lab results please work on healthy diet and weight loss. Your blood pressure is slightly elevated you can work to improve this with exercise and weight lossDASH Diet The DASH diet stands for "Dietary Approaches to Stop Hypertension." It is a healthy eating plan that has been shown to reduce high blood pressure (hypertension) in as little as 14 days, while also possibly providing other significant health benefits. These other health benefits include reducing the risk of breast cancer after menopause and reducing the risk of type 2 diabetes, heart disease, colon cancer, and stroke. Health benefits also include weight loss and slowing kidney failure in patients with chronic kidney disease.  DIET GUIDELINES  Limit salt (sodium). Your diet should contain less than 1500 mg of sodium daily.  Limit refined or processed carbohydrates. Your diet should include mostly whole grains. Desserts and added sugars should be used sparingly.  Include small amounts of heart-healthy fats. These types of fats include nuts, oils, and tub margarine. Limit saturated and trans fats. These fats have been shown to be harmful in the body. CHOOSING FOODS  The following food groups are based on a 2000 calorie diet. See your Registered Dietitian for individual calorie needs. Grains and Grain Products (6 to 8 servings daily)  Eat More Often: Whole-wheat bread, brown rice, whole-grain or wheat pasta, quinoa, popcorn without added fat or salt (air popped).  Eat Less Often: White bread, white pasta, white rice, cornbread. Vegetables (4 to 5 servings daily)  Eat More Often: Fresh, frozen, and canned vegetables. Vegetables may be raw, steamed, roasted, or grilled with a minimal amount of fat.  Eat Less Often/Avoid: Creamed or fried vegetables. Vegetables in a cheese sauce. Fruit (4 to 5 servings daily)  Eat More Often: All fresh, canned (in natural juice), or frozen fruits. Dried fruits without added  sugar. One hundred percent fruit juice ( cup [237 mL] daily).  Eat Less Often: Dried fruits with added sugar. Canned fruit in light or heavy syrup. [employer], Fish, and Poultry (2 servings or less daily. One serving is 3 to 4 oz [85-114 g]).  Eat More Often: Ninety percent or leaner ground beef, tenderloin, sirloin. Round cuts of beef, chicken breast, [place] breast. All fish. Grill, bake, or broil your meat. Nothing should be fried.  Eat Less Often/Avoid: Fatty cuts of meat, [place], or chicken leg, thigh, or wing. Fried cuts of meat or fish. Dairy (2 to 3 servings)  Eat More Often: Low-fat or fat-free milk, low-fat plain or light yogurt, reduced-fat or part-skim cheese.  Eat Less Often/Avoid: Milk (whole, 2%).Whole milk yogurt. Full-fat cheeses. Nuts, Seeds, and Legumes (4 to 5 servings per week)  Eat More Often: All without added salt.  Eat Less Often/Avoid: Salted nuts and seeds, canned beans with added salt. Fats and Sweets (limited)  Eat More Often: Vegetable oils, tub margarines without trans fats, sugar-free gelatin. Mayonnaise and salad dressings.  Eat Less Often/Avoid: Coconut oils, palm oils, butter, stick margarine, cream, half and half, cookies, candy, pie. FOR MORE INFORMATION The Dash Diet Eating Plan: www.dashdiet.org Document Released: 02/12/2011 Document Revised: 05/18/2011 Document Reviewed: 02/12/2011 [facility] Patient Information 2013 [place], [place].  Shingles Vaccine What You Need to Know WHAT IS SHINGLES?  Shingles is a painful skin rash, often with blisters. It is also called Herpes Zoster or just Zoster.  A shingles rash usually appears on one side of the face or body and lasts from 2 to 4 weeks. Its main symptom is  pain, which can be quite severe. Other symptoms of shingles can include fever, headache, chills, and upset stomach. Very rarely, a shingles infection can lead to pneumonia, hearing problems, blindness, brain inflammation (encephalitis), or  death.  For about 1 person in 5, severe pain can continue even after the rash clears up. This is called post-herpetic neuralgia.  Shingles is caused by the Varicella Zoster virus. This is the same virus that causes chickenpox. Only someone who has had a case of chickenpox or rarely, has gotten chickenpox vaccine, can get shingles. The virus stays in your body. It can reappear many years later to cause a case of shingles.  You cannot catch shingles from another person with shingles. However, a person who has never had chickenpox (or chickenpox vaccine) could get chickenpox from someone with shingles. This is not very common.  Shingles is far more common in people 19 and older than in younger people. It is also more common in people whose immune systems are weakened because of a disease such as cancer or drugs such as steroids or chemotherapy.  At least 1 million people get shingles per year in the [place]. SHINGLES VACCINE  A vaccine for shingles was licensed in 2006. In clinical trials, the vaccine reduced the risk of shingles by 50%. It can also reduce the pain in people who still get shingles after being vaccinated.  A single dose of shingles vaccine is recommended for adults 84 years of age and older. SOME PEOPLE SHOULD NOT GET SHINGLES VACCINE OR SHOULD WAIT A person should not get shingles vaccine if he or she:  Has ever had a life-threatening allergic reaction to gelatin, the antibiotic neomycin, or any other component of shingles vaccine. Tell your caregiver if you have any severe allergies.  Has a weakened immune system because of current:  AIDS or another disease that affects the immune system.  Treatment with drugs that affect the immune system, such as prolonged use of high-dose steroids.  Cancer treatment, such as radiation or chemotherapy.  Cancer affecting the bone marrow or lymphatic system, such as leukemia or lymphoma.  Is pregnant, or might be pregnant. Women  should not become pregnant until at least 4 weeks after getting shingles vaccine. Someone with a minor illness, such as a cold, may be vaccinated. Anyone with a moderate or severe acute illness should usually wait until he or she recovers before getting the vaccine. This includes anyone with a temperature of 101.3 F (38 C) or higher. WHAT ARE THE RISKS FROM SHINGLES VACCINE?  A vaccine, like any medicine, could possibly cause serious problems, such as severe allergic reactions. However, the risk of a vaccine causing serious harm, or death, is extremely small.  No serious problems have been identified with shingles vaccine. Mild Problems  Redness, soreness, swelling, or itching at the site of the injection (about 1 person in 3).  Headache (about 1 person in 70). Like all vaccines, shingles vaccine is being closely monitored for unusual or severe problems. WHAT IF THERE IS A MODERATE OR SEVERE REACTION? What should I look for? Any unusual condition, such as a severe allergic reaction or a high fever. If a severe allergic reaction occurred, it would be within a few minutes to an hour after the shot. Signs of a serious allergic reaction can include difficulty breathing, weakness, hoarseness or wheezing, a fast heartbeat, hives, dizziness, paleness, or swelling of the throat. What should I do?  Call your caregiver, or get the person to a  caregiver right away.  Tell the caregiver what happened, the date and time it happened, and when the vaccination was given.  Ask the caregiver to report the reaction by filing a Vaccine Adverse Event Reporting System (VAERS) form. Or, you can file this report through the VAERS web site at www.vaers.LAgents.no or by calling 1-9022886131. VAERS does not provide medical advice. HOW CAN I LEARN MORE?  Ask your caregiver. He or she can give you the vaccine package insert or suggest other sources of information.  Contact the Centers for Disease Control and  Prevention (CDC):  Call 321-818-1793 (1-800-CDC-INFO).  Visit the CDC website at PicCapture.uy CDC Shingles Vaccine VIS (12/13/07) Document Released: 12/21/2005 Document Revised: 05/18/2011 Document Reviewed: 12/13/2007 [facility] Patient Information 2013 [place], [place].

## 2012-07-14 NOTE — Progress Notes (Signed)
@[email or web address] @  Patient ID: [name] MRN: [id number], DOB: 25-Nov-1960 52 y.o. Date of Encounter: 07/14/2012, 11:55 AM  Primary Physician: Tally Due, MD  Chief Complaint: Physical (CPE)  HPI: 52 y.o. y/o male with history noted below here for CPE.  Doing well. He has been having joint pain. Has hx of hyperlipidemia and gout.  Review of Systems Consitutional: No fever, chills, fatigue, night sweats, lymphadenopathy, or weight changes. Eyes: No visual changes, eye redness, or discharge. ENT/Mouth: Ears: No otalgia, tinnitus, hearing loss, discharge. Nose: No congestion, rhinorrhea, sinus pain, or epistaxis. Throat: No sore throat, post nasal drip, or teeth pain. Cardiovascular: No CP, palpitations, diaphoresis, DOE, edema, orthopnea, PND. Respiratory: No cough, hemoptysis, SOB, or wheezing. Gastrointestinal: No anorexia, dysphagia, reflux, pain, nausea, vomiting, hematemesis, diarrhea, constipation, BRBPR, or melena. Genitourinary: No dysuria, frequency, urgency, hematuria, incontinence, nocturia, decreased urinary stream, discharge, impotence, or testicular pain/masses. Musculoskeletal:  decreased ROM, myalgias, stiffness, joint swelling, or weakness. Skin: No rash, erythema, lesion changes, pain, warmth, jaundice, or pruritis. Neurological: No headache, dizziness, syncope, seizures, tremors, memory loss, coordination problems, or paresthesias. Psychological: No anxiety, depression, hallucinations, SI/HI. Endocrine: No fatigue, polydipsia, polyphagia, polyuria, or known diabetes. All other systems were reviewed and are otherwise negative.  Past Medical History  Diagnosis Date  . Hyperlipidemia     takes Simvastatin daily  . Arthritis   . Joint pain   . Chronic neck pain   . History of kidney stones     last time 81yrs ago  . Gout     takes Allopurinol daily and Colchicine daily  . Sleep apnea     requested sleep study from [facility]  . Allergy      Past Surgical  History  Procedure Laterality Date  . Shoulder surgery  53yrs ago and then 2013    x 2  . Carpal tunnel release  2008    right  . Cervical fusion  2003  . Knee arthroscopy  15+yrs ago    right   . Vasectomy  61yrs ago  . Shoulder surgery  74yrs ago    left   . Colonosocpy    . Circumcision  45yrs  . Anterior cervical decomp/discectomy fusion  11/19/2011    Procedure: ANTERIOR CERVICAL DECOMPRESSION/DISCECTOMY FUSION 1 LEVEL;  Surgeon: Emilee Hero, MD;  Location: [facility] OR;  Service: Orthopedics;  Laterality: Right;  Anterior cervical decompression fusion, cervical 4-5 with instrumentation and allograft.    Home Meds:  Prior to Admission medications   Medication Sig Start Date End Date Taking? Authorizing Provider  aspirin 81 MG tablet Take 81 mg by mouth daily.   Yes Historical Provider, MD  b complex vitamins tablet Take 1 tablet by mouth daily.   Yes Historical Provider, MD  cholecalciferol (VITAMIN D) 400 UNITS TABS Take 400 Units by mouth daily.    Yes Historical Provider, MD  Omega-3 Fatty Acids (FISH OIL) 1200 MG CAPS Take 1,200 mg by mouth daily.   Yes Historical Provider, MD  allopurinol (ZYLOPRIM) 100 MG tablet Take 100 mg by mouth 2 (two) times daily.    Historical Provider, MD  allopurinol (ZYLOPRIM) 100 MG tablet TAKE ONE TABLET BY MOUTH TWICE DAILY FOR GOUT 05/16/12   Raymon Mutton Dunn, PA-C  clindamycin (CLEOCIN T) 1 % lotion Apply 1 application topically 2 (two) times daily as needed. For affected area    Historical Provider, MD  colchicine 0.6 MG tablet Take 0.6 mg by mouth daily as needed. For gout attacks  Historical Provider, MD  fluticasone (FLONASE) 50 MCG/ACT nasal spray Place 2 sprays into the nose daily as needed. For allergies    Historical Provider, MD  simvastatin (ZOCOR) 40 MG tablet Take 40 mg by mouth at bedtime. 08/12/11   Anders Simmonds, PA-C  simvastatin (ZOCOR) 40 MG tablet TAKE ONE TABLET BY MOUTH AT BEDTIME 05/16/12   Sondra Barges, PA-C     Allergies: No Known Allergies  History   Social History  . Marital Status: Married    Spouse Name: N/A    Number of Children: N/A  . Years of Education: N/A   Occupational History  . Not on file.   Social History Main Topics  . Smoking status: Never Smoker   . Smokeless tobacco: Not on file  . Alcohol Use: No  . Drug Use: No  . Sexually Active: Yes -- Male partner(s)   Other Topics Concern  . Not on file   Social History Narrative  . No narrative on file    Family History  Problem Relation Age of Onset  . Cancer Mother     breast  . Cancer Sister     breast    Physical Exam:  Blood pressure 138/96, pulse 82, temperature 98.6 F (37 C), temperature source Oral, resp. rate 18, height 5' 10.5" (1.791 m), weight 249 lb 3.2 oz (113.036 kg), SpO2 98.00%.  General: Well developed, well nourished, in no acute distress. HEENT: Normocephalic, atraumatic. Conjunctiva pink, sclera non-icteric. Pupils 2 mm constricting to 1 mm, round, regular, and equally reactive to light and accomodation. EOMI. Internal auditory canal clear. TMs with good cone of light and without pathology. Nasal mucosa pink. Nares are without discharge. No sinus tenderness. Oral mucosa pink. Dentition . Pharynx without exudate.   Neck: Supple. Trachea midline. No thyromegaly. Full ROM. No lymphadenopathy. Lungs: Clear to auscultation bilaterally without wheezes, rales, or rhonchi. Breathing is of normal effort and unlabored. Cardiovascular: RRR with S1 S2. No murmurs, rubs, or gallops appreciated. Distal pulses 2+ symmetrically. No carotid or abdominal bruits.  Abdomen: Soft, non-tender, non-distended with normoactive bowel sounds. No hepatosplenomegaly or masses. No rebound/guarding. No CVA tenderness. Without hernias.  Rectal: No external hemorrhoids or fissures. Rectal vault without masses.   Genitourinary: un circumcised male. No penile lesions. Testes descended bilaterally, and smooth without  tenderness or masses.  Musculoskeletal: Full range of motion and 5/5 strength throughout. Without swelling, atrophy, tenderness, crepitus, or warmth. Extremities without clubbing, cyanosis, or edema. Calves supple. Skin: Warm and moist without erythema, ecchymosis, wounds, or rash. Neuro: A+Ox3. CN II-XII grossly intact. Moves all extremities spontaneously. Full sensation throughout. Normal gait. DTR 2+ throughout upper and lower extremities. Finger to nose intact. Psych:  Responds to questions appropriately with a normal affect.   Results for orders placed in visit on 07/14/12  GLUCOSE, POCT (MANUAL RESULT ENTRY)      Result Value Range   POC Glucose 82  70 - 99 mg/dl  POCT UA - MICROSCOPIC ONLY      Result Value Range   WBC, Ur, HPF, POC 3-6     RBC, urine, microscopic 0-1     Bacteria, U Microscopic 1+     Mucus, UA positive     Epithelial cells, urine per micros 2-5     Crystals, Ur, HPF, POC neg     Casts, Ur, LPF, POC neg     Yeast, UA neg    POCT URINALYSIS DIPSTICK      Result Value Range  Color, UA yellow     Clarity, UA clear     Glucose, UA neg     Bilirubin, UA neg     Ketones, UA trace     Spec Grav, UA 1.025     Blood, UA neg     pH, UA 6.0     Protein, UA 30     Urobilinogen, UA 0.2     Nitrite, UA neg     Leukocytes, UA Negative    IFOBT (OCCULT BLOOD)      Result Value Range   IFOBT Negative     Studies: CBC, CMET, Lipid, PSA, were drawn. With the liiposciences  test done to fractionate his LDL. Uric acid was also done.      Assessment/Plan:  52 y.o. y/o male here for general physical exam. He has a history of high cholesterol and gout. He has had multiple orthopedic procedures done on his neck and shoulders. He is currently seeking disability. He has been off his medicines for a good period of Time due to to lack of insurance.  -  Signed, Earl Lites, MD 07/14/2012 11:55 AM

## 2012-07-15 ENCOUNTER — Other Ambulatory Visit: Payer: Self-pay | Admitting: Radiology

## 2012-07-15 LAB — TSH: TSH: 0.852 u[IU]/mL (ref 0.350–4.500)

## 2012-07-15 LAB — TESTOSTERONE, FREE, TOTAL, SHBG
Sex Hormone Binding: 22 nmol/L (ref 13–71)
Testosterone, Free: 87.2 pg/mL (ref 47.0–244.0)
Testosterone: 352 ng/dL (ref 300–890)

## 2012-07-15 LAB — CBC
MCV: 85.9 fL (ref 78.0–100.0)
Platelets: 208 10*3/uL (ref 150–400)
RBC: 5.26 MIL/uL (ref 4.22–5.81)
WBC: 4.3 10*3/uL (ref 4.0–10.5)

## 2012-07-15 LAB — VITAMIN D 25 HYDROXY (VIT D DEFICIENCY, FRACTURES): Vit D, 25-Hydroxy: 29 ng/mL — ABNORMAL LOW (ref 30–89)

## 2012-07-15 LAB — COMPREHENSIVE METABOLIC PANEL
ALT: 23 U/L (ref 0–53)
Albumin: 4.6 g/dL (ref 3.5–5.2)
CO2: 26 mEq/L (ref 19–32)
Calcium: 9.7 mg/dL (ref 8.4–10.5)
Chloride: 105 mEq/L (ref 96–112)
Creat: 1.12 mg/dL (ref 0.50–1.35)
Potassium: 4 mEq/L (ref 3.5–5.3)
Total Protein: 7.3 g/dL (ref 6.0–8.3)

## 2012-07-15 LAB — LIPID PANEL: Cholesterol: 240 mg/dL — ABNORMAL HIGH (ref 0–200)

## 2012-07-15 MED ORDER — ATORVASTATIN CALCIUM 40 MG PO TABS: 40.0000 mg | ORAL_TABLET | Freq: Every day | ORAL | Status: DC

## 2012-07-15 NOTE — Telephone Encounter (Signed)
lipitor sent in for him he is aware.

## 2012-07-22 ENCOUNTER — Telehealth: Payer: Self-pay | Admitting: Emergency Medicine

## 2012-07-22 NOTE — Telephone Encounter (Signed)
Please call patient let him know his lipid profile shows that he definitely needs to be on a statin. Please mail him a copy. I would suggest either Crestor 10 mg a day or Lipitor 40 mg a day to start if he is willing call him 30 of these no one with 5 refills was advised to check his lipid panel in 2-3 month

## 2012-07-25 NOTE — Telephone Encounter (Signed)
He was given Lipitor 07/15/12.

## 2012-08-17 ENCOUNTER — Encounter: Payer: Self-pay | Admitting: Emergency Medicine

## 2012-11-24 ENCOUNTER — Ambulatory Visit (INDEPENDENT_AMBULATORY_CARE_PROVIDER_SITE_OTHER): Payer: Federal, State, Local not specified - PPO | Admitting: Family Medicine

## 2012-11-24 VITALS — BP 120/78 | HR 57 | Temp 98.0°F | Resp 18 | Ht 70.5 in | Wt 254.0 lb

## 2012-11-24 DIAGNOSIS — E785 Hyperlipidemia, unspecified: Secondary | ICD-10-CM

## 2012-11-24 DIAGNOSIS — M25511 Pain in right shoulder: Secondary | ICD-10-CM

## 2012-11-24 DIAGNOSIS — Z79899 Other long term (current) drug therapy: Secondary | ICD-10-CM

## 2012-11-24 DIAGNOSIS — M25519 Pain in unspecified shoulder: Secondary | ICD-10-CM

## 2012-11-24 LAB — COMPREHENSIVE METABOLIC PANEL
AST: 28 U/L (ref 0–37)
Albumin: 4.3 g/dL (ref 3.5–5.2)
Alkaline Phosphatase: 65 U/L (ref 39–117)
Potassium: 4.2 mEq/L (ref 3.5–5.3)
Sodium: 140 mEq/L (ref 135–145)
Total Bilirubin: 0.9 mg/dL (ref 0.3–1.2)
Total Protein: 7.1 g/dL (ref 6.0–8.3)

## 2012-11-24 LAB — LIPID PANEL
HDL: 45 mg/dL (ref >39)
Total CHOL/HDL Ratio: 3.1 Ratio
VLDL: 20 mg/dL (ref 0–40)

## 2012-11-24 MED ORDER — OXYCODONE-ACETAMINOPHEN 5-325 MG PO TABS: 1.0000 | ORAL_TABLET | Freq: Three times a day (TID) | ORAL | Status: DC | PRN

## 2012-11-24 MED ORDER — CELECOXIB 200 MG PO CAPS: 200.0000 mg | ORAL_CAPSULE | Freq: Every day | ORAL | Status: DC

## 2012-11-24 NOTE — Patient Instructions (Signed)
We will try a short-term trial of daily celebrex with additional as needed oxycodone for pain.  Rest the shoulder for several weeks and alternate heat and ice 3 times a day for the next 2 weeks.  Make sure you continue very gentle stretching - especially of your neck.  If you are still having pain in another month, we need to decide whether you want to go back to Dr. Ave Filter for repeat evaluation, consider repeat imaging and/or PT of the shoulder, or get you to pain management.  If you want to be seen at our office about continuing or changing your pain medication, you need to see me.  [facility] Policy for Prescribing Controlled Substances (Revised 01/2012) 1. Prescriptions for controlled substances will be filled by ONE provider at [facility] with whom you have established and developed a plan for your care, including follow-up. 2. You are encouraged to schedule an appointment with your prescriber at our appointment center for follow-up visits whenever possible. 3. If you request a prescription for the controlled substance while at [facility] for an acute problem (with someone other than your regular prescriber), you MAY be given a ONE-TIME prescription for a 30-day supply of the controlled substance, to allow time for you to return to see your regular prescriber for additional prescriptions.

## 2012-11-24 NOTE — Progress Notes (Signed)
Subjective:    Patient ID: [name], male    DOB: [birth year], 52 y.o.   MRN: [id number]  HPI  Has been off of allopurinol and cholchicine for a while now with no gout flaires.  Last labs showed uric acid of 7.6 off of these medications.  Restarted on atorvastatin 40 since his last lipid panel so has been on for 4 months now and has been taking fish oil 1 tab once a day as well. Is fasting today.  His shoulder is really bothering him.  Has had 1 surgery on his left shoulder, 2 neck surgeries by Dr. Yevette Edwards and 2 right shoulder surgeries through Dr. Ave Filter - last being 05/2011 followed by full PT which didn't help but since the surgery he now is having worst pain. He had to retire due to his shoulder pain as couldn't do his job at all. Is trying to stay active with light weights every other day and walking daily but still very weak and pain has been getting worse recently.  Dr. Ave Filter had told him there was nothing else that could be done for his shoulder after the last surgery.  Has not really tried any other medication other than percocet around his surgery and about a year ago was given some norco which didn't work very well.  For the past 2 wks has had more pain daily.  Prior to the past 2 weeks just had occ pain.  He is also have headaches as well.  Takes advil and lays down but doesn't really help.  Has HAs at the top of his head and tend to start when his shoulder becomes very bad.   Past Surgical History  Procedure Laterality Date  . Shoulder surgery  55yrs ago and then 2013    x 2  . Carpal tunnel release  2008    right  . Cervical fusion  2003  . Knee arthroscopy  15+yrs ago    right   . Vasectomy  29yrs ago  . Shoulder surgery  68yrs ago    left   . Colonosocpy    . Circumcision  70yrs  . Anterior cervical decomp/discectomy fusion  11/19/2011    Procedure: ANTERIOR CERVICAL DECOMPRESSION/DISCECTOMY FUSION 1 LEVEL;  Surgeon: Emilee Hero, MD;  Location: [facility] OR;   Service: Orthopedics;  Laterality: Right;  Anterior cervical decompression fusion, cervical 4-5 with instrumentation and allograft.       Review of Systems  Constitutional: Negative for fever, chills, diaphoresis, activity change and appetite change.  HENT: Positive for neck pain.   Musculoskeletal: Positive for myalgias, joint swelling and arthralgias. Negative for gait problem.  Skin: Negative for color change, rash and wound.  Neurological: Positive for weakness and headaches. Negative for numbness.  Hematological: Negative for adenopathy. Does not bruise/bleed easily.      BP 120/78  Pulse 57  Temp(Src) 98 F (36.7 C) (Oral)  Resp 18  Ht 5' 10.5" (1.791 m)  Wt 254 lb (115.214 kg)  BMI 35.92 kg/m2  SpO2 98% Objective:   Physical Exam  Constitutional: He is oriented to person, place, and time. He appears well-developed and well-nourished. No distress.  HENT:  Head: Normocephalic and atraumatic.  Eyes: Conjunctivae are normal. Pupils are equal, round, and reactive to light. No scleral icterus.  Neck: Normal range of motion. Neck supple. No thyromegaly present.  Cardiovascular: Normal rate, regular rhythm, normal heart sounds and intact distal pulses.   Pulmonary/Chest: Effort normal and breath sounds normal. No respiratory  distress.  Musculoskeletal: He exhibits no edema.       Right shoulder: He exhibits decreased range of motion, tenderness, pain and decreased strength.  Has approx 95 deg rom abduction, full adduction, 80 deg flexion right shoulder  Lymphadenopathy:    He has no cervical adenopathy.  Neurological: He is alert and oriented to person, place, and time.  Skin: Skin is warm and dry. He is not diaphoretic.  Psychiatric: He has a normal mood and affect. His behavior is normal.      Assessment & Plan:  Hyperlipidemia - Plan: Lipid panel - started on atorvastatin after last lipid panel 4 mos ago, recheck.  Encounter for long-term (current) use of other  medications - Plan: Comprehensive metabolic panel  Pain in joint, shoulder region, right - try resting it - no further weight training x sev wks, alternate heat and ice, start daily celebrex with prn oxycodone since pain has become much worse over last 2 wks.  If pain does not return to baseline, and he feels that he is continuing to need narcotics, consider f/u for repeat eval with ortho - Dr. Ave Filter - vs referral to pain management if there are no further surgical or PT options (as pt seems to perceive).  Meds ordered this encounter  Medications  . oxyCODONE-acetaminophen (ROXICET) 5-325 MG per tablet    Sig: Take 1 tablet by mouth every 8 (eight) hours as needed for pain.    Dispense:  60 tablet    Refill:  0  . celecoxib (CELEBREX) 200 MG capsule    Sig: Take 1 capsule (200 mg total) by mouth daily.    Dispense:  30 capsule    Refill:  11

## 2012-11-25 ENCOUNTER — Encounter: Payer: Self-pay | Admitting: Family Medicine

## 2012-11-25 ENCOUNTER — Telehealth: Payer: Self-pay

## 2012-11-25 NOTE — Telephone Encounter (Signed)
LMOM to Cb. Need info from pt as to what other medications (esp NSAIDS) he has tried for his shoulder pain in order to complete his PA for Celebrex.

## 2012-11-26 ENCOUNTER — Telehealth: Payer: Self-pay

## 2012-11-26 NOTE — Telephone Encounter (Signed)
Pt CB yesterday and reported he has tried ibuprofen, tylenol and Aleve for his shoulder pain in the past w/no relief, but has not tried any other Rx NSAIDS.  I completed a PA on [email or web address] and received a denial. Dr Clelia Croft, do you want to try a Rx for a different NSAID?

## 2012-11-26 NOTE — Telephone Encounter (Signed)
PT CONTACTED BY BARBARA TODAY

## 2012-11-26 NOTE — Telephone Encounter (Signed)
Patient called in regards that [facility] on [address] in [place], has question about prescription Celebrex Possible strength. Please give pt call back at [phone]

## 2012-11-30 MED ORDER — MELOXICAM 15 MG PO TABS: 15.0000 mg | ORAL_TABLET | Freq: Every day | ORAL | Status: DC

## 2012-11-30 NOTE — Telephone Encounter (Signed)
celebrex d/c'd and meloxicam sent to pharmacy. Do not use with any other otc pain medication other than tylenol/acetaminophen - so no aleve, ibuprofen, motrin, advil, etc.

## 2012-12-01 NOTE — Telephone Encounter (Signed)
LMOM for pt that PA for Celebrex was denied and meloxicam sent to pharmacy. Advised no other OTC pain meds other than tylenol while taking the meloxicam.

## 2012-12-30 ENCOUNTER — Ambulatory Visit (INDEPENDENT_AMBULATORY_CARE_PROVIDER_SITE_OTHER): Payer: Federal, State, Local not specified - PPO | Admitting: Family Medicine

## 2012-12-30 VITALS — BP 130/86 | HR 74 | Temp 98.5°F | Resp 16 | Ht 70.5 in | Wt 247.2 lb

## 2012-12-30 DIAGNOSIS — G8929 Other chronic pain: Secondary | ICD-10-CM | POA: Insufficient documentation

## 2012-12-30 DIAGNOSIS — M25519 Pain in unspecified shoulder: Secondary | ICD-10-CM

## 2012-12-30 MED ORDER — CYCLOBENZAPRINE HCL 10 MG PO TABS
10.0000 mg | ORAL_TABLET | Freq: Three times a day (TID) | ORAL | Status: DC | PRN
Start: 2012-12-30 — End: 2013-03-14

## 2012-12-30 MED ORDER — GABAPENTIN 300 MG PO CAPS: 300.0000 mg | ORAL_CAPSULE | Freq: Three times a day (TID) | ORAL | Status: DC

## 2012-12-30 MED ORDER — OXYCODONE-ACETAMINOPHEN 5-325 MG PO TABS: 1.0000 | ORAL_TABLET | Freq: Three times a day (TID) | ORAL | Status: DC | PRN

## 2012-12-30 NOTE — Patient Instructions (Signed)
Continue on the meloxicam every morning. Consider trying the cyclobenzaprine (flexeril - a muscle relaxant) at night before bed to see if it helps decrease the pain you are waking up with.  We will also start you on a trial of gabapentin. Start taking it at night - after several days when your body acclimates to the fatigue side effect - increase it to twice a day and then three times a day about a week later. Plan to come back to see me in about a month.  If you have an appointment with pain management within that time and so are getting your current needs addressed by then, you are free to cancel.

## 2012-12-30 NOTE — Progress Notes (Signed)
Subjective:    Patient ID: [name], male    DOB: 01-24-1961, 52 y.o.   MRN: [id number] Chief Complaint  Patient presents with  . recheck right shoulder   HPI  [name] is still having significant Rt shoulder pain.  He got a little relief from the oxycodone but as soon as it wore off after several hrs he would be hurting again so it wasn't a very satisfactory response. He thinks the meloxicam might be helping a tiny bit - that he is better of taking it than not - but still has significant disability. If he uses his Rt arm AT ALL - he will get a lot of pain at the end of the day - more pain with more use. Even just walking hurts his shoulder a lot - maybe because of the jarring motion.  Has not overall improved at all since our visit 5 wks ago.  His neck is doing much better since his surgery - still occ Rt neck pain with radiculopathy down Rt arm but thinks those sxs might actually be more bicep/tricep pain from his prior Rt shoulder surg - not sure.  Past Medical History  Diagnosis Date  . Hyperlipidemia     takes Simvastatin daily  . Arthritis   . Joint pain   . Chronic neck pain   . History of kidney stones     last time 63yrs ago  . Gout     takes Allopurinol daily and Colchicine daily  . Sleep apnea     requested sleep study from [facility]  . Allergy    Current Outpatient Prescriptions on File Prior to Visit  Medication Sig Dispense Refill  . aspirin 81 MG tablet Take 81 mg by mouth daily.      Marland Kitchen atorvastatin (LIPITOR) 40 MG tablet Take 1 tablet (40 mg total) by mouth daily.  90 tablet  3  . b complex vitamins tablet Take 1 tablet by mouth daily.      . cholecalciferol (VITAMIN D) 400 UNITS TABS Take 400 Units by mouth daily.       . clindamycin (CLEOCIN T) 1 % lotion Apply 1 application topically 2 (two) times daily as needed. For affected area      . meloxicam (MOBIC) 15 MG tablet Take 1 tablet (15 mg total) by mouth daily.  30 tablet  2  . Omega-3 Fatty Acids (FISH  OIL) 1200 MG CAPS Take 1,200 mg by mouth daily.       No current facility-administered medications on file prior to visit.   No Known Allergies  Review of Systems  Constitutional: Negative for fever, chills, diaphoresis, activity change and appetite change.  Musculoskeletal: Positive for arthralgias, joint swelling, myalgias, neck pain and neck stiffness. Negative for gait problem.  Skin: Negative for color change, rash and wound.  Neurological: Positive for weakness. Negative for numbness.  Hematological: Negative for adenopathy. Does not bruise/bleed easily.  Psychiatric/Behavioral: Positive for sleep disturbance.      BP 130/86  Pulse 74  Temp(Src) 98.5 F (36.9 C) (Oral)  Resp 16  Ht 5' 10.5" (1.791 m)  Wt 247 lb 3.2 oz (112.129 kg)  BMI 34.96 kg/m2  SpO2 98% Objective:   Physical Exam  Constitutional: He is oriented to person, place, and time. He appears well-developed and well-nourished. No distress.  HENT:  Head: Normocephalic and atraumatic.  Eyes: Conjunctivae are normal. Pupils are equal, round, and reactive to light. No scleral icterus.  Neck: Normal range  of motion. Neck supple. No thyromegaly present.  Cardiovascular: Normal rate, regular rhythm, normal heart sounds and intact distal pulses.   Pulmonary/Chest: Effort normal and breath sounds normal. No respiratory distress.  Musculoskeletal: He exhibits no edema.  Lymphadenopathy:    He has no cervical adenopathy.  Neurological: He is alert and oriented to person, place, and time.  Skin: Skin is warm and dry. He is not diaphoretic.  Psychiatric: He has a normal mood and affect. His behavior is normal.          Assessment & Plan:   Chronic right shoulder pain - Plan: Ambulatory referral to Pain Clinic Called Dr. Veda Canning office - when pt was released last yr he was having minimal pain and Dr. Ave Filter stated he would be happy to see him again if his pain is worse - their office will plan to contact pt to  arrange an appt. Will also refer to pain management - pt is open to type - would not mind narcotic pain management if it would work but has not gotten great results from the oxycodone so will try Dr. Wynn Banker first for alternatives to narcotics. Cont on the meloxicam for now, try adding in gabapentin - titrate up - and prn flexeril and prn oxycodone.  Pt may benefit from a topical cream or preparation as well - discuss at f/u. Plan to f/u in 1 mo unless he has been able to get established w/ specialists to get his needs addressed. Meds ordered this encounter  Medications  . oxyCODONE-acetaminophen (ROXICET) 5-325 MG per tablet    Sig: Take 1-2 tablets by mouth every 8 (eight) hours as needed for pain.    Dispense:  60 tablet    Refill:  0  . cyclobenzaprine (FLEXERIL) 10 MG tablet    Sig: Take 1 tablet (10 mg total) by mouth 3 (three) times daily as needed for muscle spasms.    Dispense:  30 tablet    Refill:  1  . gabapentin (NEURONTIN) 300 MG capsule    Sig: Take 1 capsule (300 mg total) by mouth 3 (three) times daily.    Dispense:  90 capsule    Refill:  1   Did get his flu shot this yr already  Glenn Sorenson, MD MPH

## 2012-12-30 NOTE — Progress Notes (Signed)
  Subjective:    Patient ID: [name], male    DOB: 1960-10-17, 52 y.o.   MRN: [id number] Chief Complaint  Patient presents with  . recheck right shoulder   HPI  Taking the meloxicam daily.  Didn't get any motion and comfort in the shoulder.   Having a lot of pain first thing upon awakening and then towards the end of the day  Walking every day - just the motion of walking can cause pain in the shoulder.  Still has occ pain in the side of neck going down his right arm but overall Still bicep/tricept pain.    Review of Systems     Objective:   Physical Exam        Assessment & Plan:

## 2013-01-05 ENCOUNTER — Encounter: Payer: Self-pay | Admitting: Physical Medicine & Rehabilitation

## 2013-02-06 ENCOUNTER — Ambulatory Visit (HOSPITAL_BASED_OUTPATIENT_CLINIC_OR_DEPARTMENT_OTHER): Payer: Federal, State, Local not specified - PPO | Admitting: Physical Medicine & Rehabilitation

## 2013-02-06 ENCOUNTER — Encounter: Payer: Federal, State, Local not specified - PPO | Attending: Physical Medicine & Rehabilitation

## 2013-02-06 ENCOUNTER — Encounter: Payer: Self-pay | Admitting: Physical Medicine & Rehabilitation

## 2013-02-06 VITALS — BP 147/80 | HR 67 | Resp 14 | Ht 70.0 in | Wt 250.0 lb

## 2013-02-06 DIAGNOSIS — Z981 Arthrodesis status: Secondary | ICD-10-CM | POA: Insufficient documentation

## 2013-02-06 DIAGNOSIS — Z79899 Other long term (current) drug therapy: Secondary | ICD-10-CM

## 2013-02-06 DIAGNOSIS — M545 Low back pain, unspecified: Secondary | ICD-10-CM | POA: Insufficient documentation

## 2013-02-06 DIAGNOSIS — M542 Cervicalgia: Secondary | ICD-10-CM | POA: Insufficient documentation

## 2013-02-06 DIAGNOSIS — G4730 Sleep apnea, unspecified: Secondary | ICD-10-CM | POA: Insufficient documentation

## 2013-02-06 DIAGNOSIS — M79609 Pain in unspecified limb: Secondary | ICD-10-CM | POA: Insufficient documentation

## 2013-02-06 DIAGNOSIS — M25519 Pain in unspecified shoulder: Secondary | ICD-10-CM

## 2013-02-06 DIAGNOSIS — M25511 Pain in right shoulder: Secondary | ICD-10-CM

## 2013-02-06 DIAGNOSIS — M961 Postlaminectomy syndrome, not elsewhere classified: Secondary | ICD-10-CM

## 2013-02-06 DIAGNOSIS — R209 Unspecified disturbances of skin sensation: Secondary | ICD-10-CM | POA: Insufficient documentation

## 2013-02-06 DIAGNOSIS — Z5181 Encounter for therapeutic drug level monitoring: Secondary | ICD-10-CM

## 2013-02-06 DIAGNOSIS — E785 Hyperlipidemia, unspecified: Secondary | ICD-10-CM | POA: Insufficient documentation

## 2013-02-06 DIAGNOSIS — G8928 Other chronic postprocedural pain: Secondary | ICD-10-CM | POA: Insufficient documentation

## 2013-02-06 DIAGNOSIS — G8929 Other chronic pain: Secondary | ICD-10-CM | POA: Insufficient documentation

## 2013-02-06 DIAGNOSIS — M109 Gout, unspecified: Secondary | ICD-10-CM | POA: Insufficient documentation

## 2013-02-06 NOTE — Progress Notes (Signed)
Subjective:    Patient ID: [name], male    DOB: 1960/05/28, 52 y.o.   MRN: [id number]  HPI Chief complaint is neck pain as well as arm pain on the right side.  Pain worsens with activity with the right arm. Also has difficulty at night positioning his right arm for sleep. He wakes up with numbness in the right hand in the morning. All fingers are affected.   Past surgical history significant for right shoulder surgery 2013 as well as 2003, Dr Ave Filter and Dr Renae Fickle, patient reports that he was mainly trimming bone and removing calcium deposits but not actually trying to repair the tendons.  Cervical fusion 2003 Dr Newell Coral and September of 2013.Dr Yevette Edwards C4-5, nerve pain down the arm improved after this surgery  Right carpal tunnel surgery for 5 years ago, Dr Renae Fickle  Has tried oxycodone, gabapentin, meloxican, Flexeril not helpful.  Has tried heat or ice at therapy but not  No improvement with physical therapy.  Shoulder pain is worse than neck     Pain Inventory Average Pain 5 Pain Right Now 5 My pain is constant, sharp, burning, stabbing, tingling and aching  In the last 24 hours, has pain interfered with the following? General activity 7 Relation with others 6 Enjoyment of life 8 What TIME of day is your pain at its worst? night Sleep (in general) Poor  Pain is worse with: walking and some activites Pain improves with: therapy/exercise Relief from Meds: 2  Mobility walk without assistance how many minutes can you walk? 90 ability to climb steps?  yes do you drive?  yes  Function not employed: date last employed 05/19/12  Neuro/Psych weakness numbness tingling spasms  Prior Studies Any changes since last visit?  no  Physicians involved in your care Any changes since last visit?  no   Family History  Problem Relation Age of Onset  . Cancer Mother     breast  . Cancer Sister     breast   History   Social History  . Marital Status: Married      Spouse Name: N/A    Number of Children: N/A  . Years of Education: N/A   Social History Main Topics  . Smoking status: Never Smoker   . Smokeless tobacco: None  . Alcohol Use: No  . Drug Use: No  . Sexual Activity: Yes    Partners: Female   Other Topics Concern  . None   Social History Narrative  . None   Past Surgical History  Procedure Laterality Date  . Shoulder surgery  18yrs ago and then 2013    x 2  . Carpal tunnel release  2008    right  . Cervical fusion  2003  . Knee arthroscopy  15+yrs ago    right   . Vasectomy  17yrs ago  . Shoulder surgery  22yrs ago    left   . Colonosocpy    . Circumcision  78yrs  . Anterior cervical decomp/discectomy fusion  11/19/2011    Procedure: ANTERIOR CERVICAL DECOMPRESSION/DISCECTOMY FUSION 1 LEVEL;  Surgeon: Emilee Hero, MD;  Location: [facility] OR;  Service: Orthopedics;  Laterality: Right;  Anterior cervical decompression fusion, cervical 4-5 with instrumentation and allograft.   Past Medical History  Diagnosis Date  . Hyperlipidemia     takes Simvastatin daily  . Arthritis   . Joint pain   . Chronic neck pain   . History of kidney stones  last time 70yrs ago  . Gout     takes Allopurinol daily and Colchicine daily  . Sleep apnea     requested sleep study from [facility]  . Allergy    BP 147/80  Pulse 67  Resp 14  Ht 5\' 10"  (1.778 m)  Wt 250 lb (113.399 kg)  BMI 35.87 kg/m2  SpO2 98%   Review of Systems  Respiratory: Positive for apnea.   Musculoskeletal: Positive for neck pain.       Spasm  Neurological: Positive for weakness and numbness.       Tingling  All other systems reviewed and are negative.       Objective:   Physical Exam  Nursing note and vitals reviewed. Constitutional: He appears well-developed and well-nourished.  HENT:  Head: Normocephalic and atraumatic.  Eyes: Conjunctivae and EOM are normal. Pupils are equal, round, and reactive to light.  Neck: Normal range of  motion.  Musculoskeletal:       Right shoulder: He exhibits decreased range of motion and pain.  Pain with right shoulder forward flexion greater than with abduction Able to internally and externally rotate the arm   right biceps weakness 4/5 Otherwise strength is 5/5 Mild positive Leanord Asal on R side  decreased C5 and decreased index middle and little finger on right side compared to left        Assessment & Plan:  1.  Chronic post op pain complicated situation with multiple shoulder surgeries as well as Cervical radic , C5 s/p C4-5 ACDF 1 year ago, as well as prior cervical fusion (? C5-6) 10years ago.  In addition has hand numbness and prior CTS surgery on right  Overall it appears that pain is most likely from Right shoulder joint, will try R suprascapular nerve block under ultrasound guideance.  This should be diagnostic +/-  therapeutic, but if only ST  Relief x 2  consider RF  Given limited benefit of other treatments such as PT and meds will not prescribe at this time

## 2013-03-14 ENCOUNTER — Encounter: Payer: Self-pay | Admitting: Physical Medicine & Rehabilitation

## 2013-03-14 ENCOUNTER — Ambulatory Visit (HOSPITAL_BASED_OUTPATIENT_CLINIC_OR_DEPARTMENT_OTHER): Payer: Federal, State, Local not specified - PPO | Admitting: Physical Medicine & Rehabilitation

## 2013-03-14 ENCOUNTER — Encounter: Payer: Federal, State, Local not specified - PPO | Attending: Physical Medicine & Rehabilitation

## 2013-03-14 VITALS — BP 160/85 | HR 63 | Resp 14 | Ht 70.0 in | Wt 257.0 lb

## 2013-03-14 DIAGNOSIS — R209 Unspecified disturbances of skin sensation: Secondary | ICD-10-CM | POA: Insufficient documentation

## 2013-03-14 DIAGNOSIS — Z981 Arthrodesis status: Secondary | ICD-10-CM | POA: Insufficient documentation

## 2013-03-14 DIAGNOSIS — M79609 Pain in unspecified limb: Secondary | ICD-10-CM | POA: Insufficient documentation

## 2013-03-14 DIAGNOSIS — M542 Cervicalgia: Secondary | ICD-10-CM | POA: Insufficient documentation

## 2013-03-14 DIAGNOSIS — M109 Gout, unspecified: Secondary | ICD-10-CM | POA: Insufficient documentation

## 2013-03-14 DIAGNOSIS — G4730 Sleep apnea, unspecified: Secondary | ICD-10-CM | POA: Insufficient documentation

## 2013-03-14 DIAGNOSIS — G8928 Other chronic postprocedural pain: Secondary | ICD-10-CM | POA: Insufficient documentation

## 2013-03-14 DIAGNOSIS — M25519 Pain in unspecified shoulder: Secondary | ICD-10-CM

## 2013-03-14 DIAGNOSIS — M25511 Pain in right shoulder: Principal | ICD-10-CM

## 2013-03-14 DIAGNOSIS — G8929 Other chronic pain: Secondary | ICD-10-CM

## 2013-03-14 DIAGNOSIS — E785 Hyperlipidemia, unspecified: Secondary | ICD-10-CM | POA: Insufficient documentation

## 2013-03-14 NOTE — Progress Notes (Signed)
Right suprascapular nerve block under ultrasound guidance  Chronic right shoulder pain after 3 shoulder surgeries, patient also has neck pain on the right  Informed consent was obtained after describing risks and benefits of the procedure with the patient including bleeding bruising and infection as well as pneumothorax. Patient has given written consent  Patient placed in a seated position with the right arm across the chest Area above the spine of the scapula was scanned suprascapular notch identified Area marked and prepped with Betadine and draped in a sterile manner  25-gauge 1.5 inch needle was used to anesthetize skin and subcutaneous tissue 1% lidocaine x2 cc Then a 22-gauge ankle block needle was inserted targeting the suprascapular notch, needle tip of approximated the notch and solution containing one ML Celestone 6 mg +4 ML Marcaine 0.25% infiltrated Patient tolerated procedure well Post procedure instructions given

## 2013-03-14 NOTE — Patient Instructions (Signed)
Right suprascapular nerve block with Marcaine and Celestone The Marcaine should last all day The Celestone should start working tomorrow  This injection can last days weeks or months If it is not helpful, it may indicate that much of your shoulder pain is coming from your neck

## 2013-04-11 ENCOUNTER — Encounter: Payer: Federal, State, Local not specified - PPO | Attending: Physical Medicine & Rehabilitation

## 2013-04-11 ENCOUNTER — Ambulatory Visit (HOSPITAL_BASED_OUTPATIENT_CLINIC_OR_DEPARTMENT_OTHER): Payer: Federal, State, Local not specified - PPO | Admitting: Physical Medicine & Rehabilitation

## 2013-04-11 ENCOUNTER — Encounter: Payer: Self-pay | Admitting: Physical Medicine & Rehabilitation

## 2013-04-11 VITALS — BP 159/88 | HR 74 | Resp 14 | Ht 70.5 in | Wt 255.0 lb

## 2013-04-11 DIAGNOSIS — G8929 Other chronic pain: Secondary | ICD-10-CM

## 2013-04-11 DIAGNOSIS — M25519 Pain in unspecified shoulder: Secondary | ICD-10-CM

## 2013-04-11 DIAGNOSIS — M25511 Pain in right shoulder: Principal | ICD-10-CM

## 2013-04-11 DIAGNOSIS — M542 Cervicalgia: Secondary | ICD-10-CM | POA: Insufficient documentation

## 2013-04-11 DIAGNOSIS — E785 Hyperlipidemia, unspecified: Secondary | ICD-10-CM | POA: Insufficient documentation

## 2013-04-11 DIAGNOSIS — R209 Unspecified disturbances of skin sensation: Secondary | ICD-10-CM | POA: Insufficient documentation

## 2013-04-11 DIAGNOSIS — M961 Postlaminectomy syndrome, not elsewhere classified: Secondary | ICD-10-CM

## 2013-04-11 DIAGNOSIS — M79609 Pain in unspecified limb: Secondary | ICD-10-CM | POA: Insufficient documentation

## 2013-04-11 DIAGNOSIS — M109 Gout, unspecified: Secondary | ICD-10-CM | POA: Insufficient documentation

## 2013-04-11 DIAGNOSIS — G8928 Other chronic postprocedural pain: Secondary | ICD-10-CM | POA: Insufficient documentation

## 2013-04-11 DIAGNOSIS — Z981 Arthrodesis status: Secondary | ICD-10-CM | POA: Insufficient documentation

## 2013-04-11 DIAGNOSIS — G4730 Sleep apnea, unspecified: Secondary | ICD-10-CM | POA: Insufficient documentation

## 2013-04-11 NOTE — Progress Notes (Signed)
deltoid is 4  Subjective:    Patient ID: [name], male    DOB: 08/06/1960, 53 y.o.   MRN: [id number]  HPI Good relief with right suprascapular nerve block under ultrasound guidance. At one week of very good relief. He feels like it may be starting to wear off now however Pain Inventory Average Pain 7 Pain Right Now 6 My pain is sharp, burning, dull, stabbing, tingling and aching  In the last 24 hours, has pain interfered with the following? General activity 6 Relation with others 6 Enjoyment of life 6 What TIME of day is your pain at its worst? night Sleep (in general) Poor  Pain is worse with: walking and some activites Pain improves with: rest, medication and injections Relief from Meds: 1  Mobility walk without assistance how many minutes can you walk? 90 ability to climb steps?  yes do you drive?  yes  Function not employed: date last employed 06/06/2012 retired  Neuro/Psych No problems in this area  Prior Studies Any changes since last visit?  no  Physicians involved in your care Any changes since last visit?  no   Family History  Problem Relation Age of Onset  . Cancer Mother     breast  . Cancer Sister     breast   History   Social History  . Marital Status: Married    Spouse Name: N/A    Number of Children: N/A  . Years of Education: N/A   Social History Main Topics  . Smoking status: Never Smoker   . Smokeless tobacco: None  . Alcohol Use: No  . Drug Use: No  . Sexual Activity: Yes    Partners: Female   Other Topics Concern  . None   Social History Narrative  . None   Past Surgical History  Procedure Laterality Date  . Shoulder surgery  2849yrs ago and then 2013    x 2  . Carpal tunnel release  2008    right  . Cervical fusion  2003  . Knee arthroscopy  15+yrs ago    right   . Vasectomy  7311yrs ago  . Shoulder surgery  2349yrs ago    left   . Colonosocpy    . Circumcision  4661yrs  . Anterior cervical decomp/discectomy fusion   11/19/2011    Procedure: ANTERIOR CERVICAL DECOMPRESSION/DISCECTOMY FUSION 1 LEVEL;  Surgeon: Emilee HeroMark Leonard Dumonski, MD;  [facility];  Service: Orthopedics;  Laterality: Right;  Anterior cervical decompression fusion, cervical 4-5 with instrumentation and allograft.   Past Medical History  Diagnosis Date  . Hyperlipidemia     takes Simvastatin daily  . Arthritis   . Joint pain   . Chronic neck pain   . History of kidney stones     last time 6924yrs ago  . Gout     takes Allopurinol daily and Colchicine daily  . Sleep apnea     requested sleep study from [facility]  . Allergy    BP 159/88  Pulse 74  Resp 14  Ht 5' 10.5" (1.791 m)  Wt 255 lb (115.667 kg)  BMI 36.06 kg/m2  SpO2 100%  Opioid Risk Score:   Fall Risk Score: Low Fall Risk (0-5 points) (patient educated handout declined)   Review of Systems  Musculoskeletal: Positive for arthralgias and myalgias.  All other systems reviewed and are negative.       Objective:   Physical Exam  Motor strength is 5/5 in bilateral upper extremities  in the biceps triceps and grip/5 right deltoid related to pain, 5/5 left deltoid Sensation intact to light touch in bilateral upper extremities. Deep tendon reflexes normal in the upper extremities Neck is with mildly reduced range of motion but no pain to palpation       Assessment & Plan:  . Chronic right shoulder pain this is likely multifactorial. 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 He has had multiple shoulder surgeries. He has responded partially to suprascapular nerve block and would like to repeat this now that it is starting to wear off. He may be a candidate for radiofrequency neurotomy or Cryoblation of the right suprascapular nerve

## 2013-04-11 NOTE — Patient Instructions (Signed)
Continue workouts Continue as needed ibuprofen Next appointment for repeat suprascapular nerve block right side with ultrasound guidance

## 2013-04-12 DIAGNOSIS — M961 Postlaminectomy syndrome, not elsewhere classified: Secondary | ICD-10-CM | POA: Insufficient documentation

## 2013-05-18 ENCOUNTER — Encounter: Payer: Federal, State, Local not specified - PPO | Attending: Physical Medicine & Rehabilitation

## 2013-05-18 ENCOUNTER — Ambulatory Visit (HOSPITAL_BASED_OUTPATIENT_CLINIC_OR_DEPARTMENT_OTHER): Payer: Federal, State, Local not specified - PPO | Admitting: Physical Medicine & Rehabilitation

## 2013-05-18 ENCOUNTER — Encounter: Payer: Self-pay | Admitting: Physical Medicine & Rehabilitation

## 2013-05-18 VITALS — BP 152/86 | HR 60 | Resp 14 | Ht 70.0 in | Wt 251.0 lb

## 2013-05-18 DIAGNOSIS — Z981 Arthrodesis status: Secondary | ICD-10-CM | POA: Insufficient documentation

## 2013-05-18 DIAGNOSIS — M25511 Pain in right shoulder: Principal | ICD-10-CM

## 2013-05-18 DIAGNOSIS — R209 Unspecified disturbances of skin sensation: Secondary | ICD-10-CM | POA: Insufficient documentation

## 2013-05-18 DIAGNOSIS — M109 Gout, unspecified: Secondary | ICD-10-CM | POA: Insufficient documentation

## 2013-05-18 DIAGNOSIS — M542 Cervicalgia: Secondary | ICD-10-CM | POA: Insufficient documentation

## 2013-05-18 DIAGNOSIS — E785 Hyperlipidemia, unspecified: Secondary | ICD-10-CM | POA: Insufficient documentation

## 2013-05-18 DIAGNOSIS — M25519 Pain in unspecified shoulder: Secondary | ICD-10-CM

## 2013-05-18 DIAGNOSIS — G8929 Other chronic pain: Secondary | ICD-10-CM

## 2013-05-18 DIAGNOSIS — G8928 Other chronic postprocedural pain: Secondary | ICD-10-CM | POA: Insufficient documentation

## 2013-05-18 DIAGNOSIS — G4730 Sleep apnea, unspecified: Secondary | ICD-10-CM | POA: Insufficient documentation

## 2013-05-18 DIAGNOSIS — M79609 Pain in unspecified limb: Secondary | ICD-10-CM | POA: Insufficient documentation

## 2013-05-18 NOTE — Progress Notes (Signed)
Right suprascapular nerve block under ultrasound guidance  Chronic right shoulder pain after 3 shoulder surgeries, patient also has neck pain on the right  Informed consent was obtained after describing risks and benefits of the procedure with the patient including bleeding bruising and infection as well as pneumothorax. Patient has given written consent  Patient placed in a seated position with the right arm across the chest Area above the spine of the scapula was scanned suprascapular notch identified Area marked and prepped with Betadine and draped in a sterile manner  25-gauge 1.5 inch needle was used to anesthetize skin and subcutaneous tissue 1% lidocaine x2 cc Then a 22-gauge ankle block needle was inserted targeting the suprascapular notch, needle tip of approximated the notch and solution containing one ML Celestone 6 mg +3ML Marcaine 0.5% infiltrated, injectate visualized entering suprascapular notch, negative doppler signal  Patient tolerated procedure well Post procedure instructions given RTC 6 wks

## 2013-05-18 NOTE — Patient Instructions (Signed)
Suprascapular nerve block celestone and marcaine Return in 6 wks , if just temporary relief consider radiofrequency or cryoablation

## 2013-06-23 ENCOUNTER — Ambulatory Visit: Payer: Federal, State, Local not specified - PPO | Admitting: Physical Medicine & Rehabilitation

## 2013-06-29 ENCOUNTER — Encounter: Payer: Self-pay | Admitting: Physical Medicine & Rehabilitation

## 2013-06-29 ENCOUNTER — Ambulatory Visit (HOSPITAL_BASED_OUTPATIENT_CLINIC_OR_DEPARTMENT_OTHER): Payer: Federal, State, Local not specified - PPO | Admitting: Physical Medicine & Rehabilitation

## 2013-06-29 ENCOUNTER — Encounter: Payer: Federal, State, Local not specified - PPO | Attending: Physical Medicine & Rehabilitation

## 2013-06-29 VITALS — BP 131/83 | HR 60 | Resp 14 | Ht 70.5 in | Wt 247.8 lb

## 2013-06-29 DIAGNOSIS — Z981 Arthrodesis status: Secondary | ICD-10-CM | POA: Insufficient documentation

## 2013-06-29 DIAGNOSIS — M25519 Pain in unspecified shoulder: Secondary | ICD-10-CM

## 2013-06-29 DIAGNOSIS — R209 Unspecified disturbances of skin sensation: Secondary | ICD-10-CM | POA: Insufficient documentation

## 2013-06-29 DIAGNOSIS — M542 Cervicalgia: Secondary | ICD-10-CM | POA: Insufficient documentation

## 2013-06-29 DIAGNOSIS — M961 Postlaminectomy syndrome, not elsewhere classified: Secondary | ICD-10-CM

## 2013-06-29 DIAGNOSIS — G4730 Sleep apnea, unspecified: Secondary | ICD-10-CM | POA: Insufficient documentation

## 2013-06-29 DIAGNOSIS — E785 Hyperlipidemia, unspecified: Secondary | ICD-10-CM | POA: Insufficient documentation

## 2013-06-29 DIAGNOSIS — G8929 Other chronic pain: Secondary | ICD-10-CM

## 2013-06-29 DIAGNOSIS — M79609 Pain in unspecified limb: Secondary | ICD-10-CM | POA: Insufficient documentation

## 2013-06-29 DIAGNOSIS — M109 Gout, unspecified: Secondary | ICD-10-CM | POA: Insufficient documentation

## 2013-06-29 DIAGNOSIS — G8928 Other chronic postprocedural pain: Secondary | ICD-10-CM | POA: Insufficient documentation

## 2013-06-29 NOTE — Progress Notes (Signed)
Subjective:    Patient ID: [name], male    DOB: 05/03/1960, 53 y.o.   MRN: [id number] Pain worsens with activity with the right arm. Also has difficulty at night positioning his right arm for sleep. He wakes up with numbness in the right hand in the morning. All fingers are affected.   Past surgical history significant for right shoulder surgery 2013 as well as 2003, Dr Ave Filterhandler and Dr Renae FicklePaul, patient reports that he was mainly trimming bone and removing calcium deposits but not actually trying to repair the tendons.  Cervical fusion 2003 Dr Newell CoralNudelman and September of 2013.Dr Yevette Edwardsumonski C4-5, nerve pain down the arm improved after this surgery HPI Patient had 50% relief with second right suprascapular nerve block which lasted the day of the procedure. By the next day pain was back to usual levels  Patient also complains of right sided neck pain as well as right forearm pain.  Pain Inventory Average Pain 7 Pain Right Now 6 My pain is sharp, burning, dull, stabbing, tingling and aching  In the last 24 hours, has pain interfered with the following? General activity 4 Relation with others 0 Enjoyment of life 5 What TIME of day is your pain at its worst? morning and night Sleep (in general) Poor  Pain is worse with: walking and some activites Pain improves with: nothing Relief from Meds: 0  Mobility walk without assistance how many minutes can you walk? 90 ability to climb steps?  yes do you drive?  yes  Function retired  Neuro/Psych No problems in this area  Prior Studies Any changes since last visit?  no  Physicians involved in your care Any changes since last visit?  no   Family History  Problem Relation Age of Onset  . Cancer Mother     breast  . Cancer Sister     breast   History   Social History  . Marital Status: Married    Spouse Name: N/A    Number of Children: N/A  . Years of Education: N/A   Social History Main Topics  . Smoking status: Never  Smoker   . Smokeless tobacco: None  . Alcohol Use: No  . Drug Use: No  . Sexual Activity: Yes    Partners: Female   Other Topics Concern  . None   Social History Narrative  . None   Past Surgical History  Procedure Laterality Date  . Shoulder surgery  7572yrs ago and then 2013    x 2  . Carpal tunnel release  2008    right  . Cervical fusion  2003  . Knee arthroscopy  15+yrs ago    right   . Vasectomy  5330yrs ago  . Shoulder surgery  6672yrs ago    left   . Colonosocpy    . Circumcision  4075yrs  . Anterior cervical decomp/discectomy fusion  11/19/2011    Procedure: ANTERIOR CERVICAL DECOMPRESSION/DISCECTOMY FUSION 1 LEVEL;  Surgeon: Emilee HeroMark Leonard Dumonski, MD;  [facility];  Service: Orthopedics;  Laterality: Right;  Anterior cervical decompression fusion, cervical 4-5 with instrumentation and allograft.   Past Medical History  Diagnosis Date  . Hyperlipidemia     takes Simvastatin daily  . Arthritis   . Joint pain   . Chronic neck pain   . History of kidney stones     last time 2260yrs ago  . Gout     takes Allopurinol daily and Colchicine daily  . Sleep apnea  requested sleep study from [facility]  . Allergy    BP 131/83  Pulse 60  Resp 14  Ht 5' 10.5" (1.791 m)  Wt 247 lb 12.8 oz (112.401 kg)  BMI 35.04 kg/m2  SpO2 100%  Opioid Risk Score:   Fall Risk Score:  (educated and handout on fall prevention in the home given at previous visit) Review of Systems  Musculoskeletal: Positive for neck pain.       Right arm pain  All other systems reviewed and are negative.      Objective:   Physical Exam  Nursing note and vitals reviewed. Constitutional: He is oriented to person, place, and time. He appears well-developed and well-nourished.  HENT:  Head: Normocephalic and atraumatic.  Eyes: Conjunctivae and EOM are normal. Pupils are equal, round, and reactive to light.  Musculoskeletal:       Right shoulder: He exhibits normal range of motion.        Cervical back: He exhibits decreased range of motion.  Neck range of motion 75% flexion, extension, lateral bending and rotation  Mild pain with end range internal/external rotation  Neurological: He is alert and oriented to person, place, and time.  Psychiatric: He has a normal mood and affect.   negative Hawkins right shoulder        Assessment & Plan:  1.  Chronic post op pain complicated situation with multiple shoulder surgeries as well as Cervical radic , C5 s/p C4-5 ACDF 1 year ago, as well as prior cervical fusion (? C5-6) 10years ago. His cervical pain is the main issue, no clear cut radicular findings on examination. May benefit from cervical medial branch blocks below the fusion with possible RF. Referral to [facility]  Overall it appears that pain is combination of axial neck and Right shoulder joint, the patient had 50% relief of his right shoulder pain after suprascapular nerve blocks x2 Make referral to [facility] For Cryoablation  Patient also bench pressing over 250 pounds, instructed the patient to stay on her 200 pounds and increase repetitions. Reviewed exercise program the patient is not doing any overhead lifting. He does do Lat pulldowns.   Patient does not wish to take medications on a chronic basis. Return to clinic when necessary  Over half of the 25 min visit was spent counseling and coordinating care.

## 2013-06-29 NOTE — Patient Instructions (Signed)
A referral to [facility] has been made. I will ask them to address your cervical postlaminectomy syndrome as well as chronic postoperative shoulder pain

## 2013-08-24 ENCOUNTER — Encounter: Payer: Self-pay | Admitting: Emergency Medicine

## 2013-08-24 ENCOUNTER — Ambulatory Visit (INDEPENDENT_AMBULATORY_CARE_PROVIDER_SITE_OTHER): Payer: Federal, State, Local not specified - PPO | Admitting: Emergency Medicine

## 2013-08-24 ENCOUNTER — Encounter: Payer: Self-pay | Admitting: Radiology

## 2013-08-24 VITALS — BP 138/72 | HR 64 | Temp 98.5°F | Resp 16 | Ht 70.5 in | Wt 239.0 lb

## 2013-08-24 DIAGNOSIS — Z0000 Encounter for general adult medical examination without abnormal findings: Secondary | ICD-10-CM

## 2013-08-24 DIAGNOSIS — M109 Gout, unspecified: Secondary | ICD-10-CM

## 2013-08-24 LAB — COMPLETE METABOLIC PANEL WITH GFR
ALT: 16 U/L (ref 0–53)
AST: 23 U/L (ref 0–37)
Albumin: 4.3 g/dL (ref 3.5–5.2)
Alkaline Phosphatase: 56 U/L (ref 39–117)
BILIRUBIN TOTAL: 0.8 mg/dL (ref 0.2–1.2)
BUN: 11 mg/dL (ref 6–23)
CO2: 25 meq/L (ref 19–32)
Calcium: 9.4 mg/dL (ref 8.4–10.5)
Chloride: 104 mEq/L (ref 96–112)
Creat: 1.15 mg/dL (ref 0.50–1.35)
GFR, EST AFRICAN AMERICAN: 84 mL/min
GFR, Est Non African American: 72 mL/min
Glucose, Bld: 84 mg/dL (ref 70–99)
POTASSIUM: 4 meq/L (ref 3.5–5.3)
SODIUM: 140 meq/L (ref 135–145)
TOTAL PROTEIN: 6.9 g/dL (ref 6.0–8.3)

## 2013-08-24 LAB — POCT URINALYSIS DIPSTICK
Bilirubin, UA: NEGATIVE
Glucose, UA: NEGATIVE
Ketones, UA: NEGATIVE
LEUKOCYTES UA: NEGATIVE
Nitrite, UA: NEGATIVE
PROTEIN UA: 30
Spec Grav, UA: 1.025
UROBILINOGEN UA: 0.2
pH, UA: 6

## 2013-08-24 LAB — CBC WITH DIFFERENTIAL/PLATELET
BASOS PCT: 0 % (ref 0–1)
Basophils Absolute: 0 10*3/uL (ref 0.0–0.1)
EOS ABS: 0 10*3/uL (ref 0.0–0.7)
Eosinophils Relative: 1 % (ref 0–5)
HEMATOCRIT: 43.9 % (ref 39.0–52.0)
HEMOGLOBIN: 14.7 g/dL (ref 13.0–17.0)
Lymphocytes Relative: 45 % (ref 12–46)
Lymphs Abs: 1.6 10*3/uL (ref 0.7–4.0)
MCH: 29.2 pg (ref 26.0–34.0)
MCHC: 33.5 g/dL (ref 30.0–36.0)
MCV: 87.3 fL (ref 78.0–100.0)
MONO ABS: 0.2 10*3/uL (ref 0.1–1.0)
MONOS PCT: 7 % (ref 3–12)
NEUTROS PCT: 47 % (ref 43–77)
Neutro Abs: 1.6 10*3/uL — ABNORMAL LOW (ref 1.7–7.7)
Platelets: 171 10*3/uL (ref 150–400)
RBC: 5.03 MIL/uL (ref 4.22–5.81)
RDW: 14.3 % (ref 11.5–15.5)
WBC: 3.5 10*3/uL — ABNORMAL LOW (ref 4.0–10.5)

## 2013-08-24 LAB — URIC ACID: URIC ACID, SERUM: 6.4 mg/dL (ref 4.0–7.8)

## 2013-08-24 LAB — LIPID PANEL
Cholesterol: 203 mg/dL — ABNORMAL HIGH (ref 0–200)
HDL: 50 mg/dL (ref >39)
LDL CALC: 136 mg/dL — AB (ref 0–99)
Total CHOL/HDL Ratio: 4.1 Ratio
Triglycerides: 85 mg/dL (ref <150)
VLDL: 17 mg/dL (ref 0–40)

## 2013-08-24 LAB — IFOBT (OCCULT BLOOD): IFOBT: NEGATIVE

## 2013-08-24 NOTE — Progress Notes (Signed)
Subjective:   This chart was scribed for Collene GobbleSteven A Daub, MD by Arlan OrganAshley Leger, [facility]. This patient was seen in room 21 and the patient's care was started 11:14 AM.    Patient ID: [name], male    DOB: 01/03/1961, 53 y.o.   MRN: [id number]  HPI  HPI Comments:  [name] is a 53 y.o. male who [facility] for a complete physical examination today. He states he has been attempting to stop all of his medications. 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 He was followed by [facility] for chronic neck and shoulder pain. Pt was seen by this clinic two times with steroid injection treatment without any improvement after the second injection. Pt has been referred to [facility] in [place], [place] for further evaulation. First follow up with this clinic on 08/28/13. He reports a PSHx of a L shoulder surgery, 2 R shoulder surgeries, and 2 neck surgeries. Shoulder surgeries performed [facility] Ave Filterhandler [facility]. He now reports ongoing HA's along with continued chronic pain he associates with all of his previous surgeries.  At this time he denies any fever, chills, CP, SOB, abdominal pain, rash, or cough. Pt has a family history of cancer: Mother had breast cancer and sister had cancer twice (type of cancer unknown). Pt is due for a follow up colonoscopy in 10 years from last colonoscopy. Pts exercise program includes walking and working out weekly. No other concerns this visit  Patient Active Problem List   Diagnosis Date Noted  . Postlaminectomy syndrome, cervical region 04/12/2013  . Other chronic pain 02/06/2013  . Chronic right shoulder pain 12/30/2012  . Hyperlipidemia 10/11/2011  . Gout 10/11/2011  . Acne 10/11/2011   Past Medical History  Diagnosis Date  . Hyperlipidemia     takes Simvastatin daily  . Arthritis   .  Joint pain   . Chronic neck pain   . History of kidney stones     last time 9358yrs ago  . Gout     takes Allopurinol daily and Colchicine daily  . Sleep apnea     requested sleep study from [facility]  . Allergy    Past Surgical History  Procedure Laterality Date  . Shoulder surgery  3273yrs ago and then 2013    x 2  . Carpal tunnel release  2008    right  . Cervical fusion  2003  . Knee arthroscopy  15+yrs ago    right   . Vasectomy  14158yrs ago  . Shoulder surgery  3973yrs ago    left   . Colonosocpy    . Circumcision  3153yrs  . Anterior cervical decomp/discectomy fusion  11/19/2011    Procedure: ANTERIOR CERVICAL DECOMPRESSION/DISCECTOMY FUSION 1 LEVEL;  Surgeon: Emilee HeroMark Leonard Dumonski, MD;  [facility];  Service: Orthopedics;  Laterality: Right;  Anterior cervical decompression fusion, cervical 4-5 with instrumentation and allograft.   No Known Allergies Prior to Admission medications   Medication Sig Start Date End Date Taking? Authorizing Provider  aspirin 81 MG tablet Take 81 mg by mouth daily.   Yes Historical Provider, MD  b complex vitamins tablet Take 1 tablet by mouth daily.   Yes Historical Provider, MD  cholecalciferol (VITAMIN D) 400 UNITS TABS Take 400 Units by mouth daily.    Yes  Historical Provider, MD  Omega-3 Fatty Acids (FISH OIL) 1200 MG CAPS Take 1,200 mg by mouth daily.   Yes Historical Provider, MD  atorvastatin (LIPITOR) 40 MG tablet Take 1 tablet (40 mg total) by mouth daily. 07/15/12   Collene GobbleSteven A Daub, MD   History   Social History  . Marital Status: Married    Spouse Name: N/A    Number of Children: N/A  . Years of Education: N/A   Occupational History  . Not on file.   Social History Main Topics  . Smoking status: Never Smoker   . Smokeless tobacco: Not on file  . Alcohol Use: No  . Drug Use: No  . Sexual Activity: Yes    Partners: Female   Other Topics Concern  . Not on file   Social History Narrative  . No narrative on file       Review of Systems  Constitutional: Negative for fever, chills, activity change, appetite change and fatigue.  HENT: Negative for congestion.   Eyes: Negative for redness.  Respiratory: Negative for cough and shortness of breath.   Cardiovascular: Negative for chest pain.  Gastrointestinal: Negative for nausea, vomiting and abdominal pain.  Musculoskeletal: Positive for arthralgias (Chronic in nature shoulder pain), neck pain (Chronic in nature) and neck stiffness.  Skin: Negative for rash.  Neurological: Negative for dizziness, weakness and numbness.  Psychiatric/Behavioral: Negative for confusion.     Objective:  Physical Exam  CONSTITUTIONAL: Well developed/well nourished HEAD: Normocephalic/atraumatic EYES: EOMI/PERRL ENMT: Mucous membranes moist NECK: supple no meningeal signs SPINE:entire spine nontender CV: S1/S2 noted, no murmurs/rubs/gallops noted LUNGS: Lungs are clear to auscultation bilaterally, no apparent distress ABDOMEN: soft, nontender, no rebound or guarding GU:no cva tenderness NEURO: Pt is awake/alert, moves all extremitiesx4 EXTREMITIES: pulses normal, full ROM SKIN: warm, color normal PSYCH: no abnormalities of mood noted    Assessment & Plan:  Patient major problem is his orthopedic problems with his neck and shoulders. He is seeking help through pain management in [place]. He had previously gone to come pain management. He is not currently on any medications I will work on diet and exercise. Routine labs were drawn today  I personally performed the services described in this documentation, which was scribed in my presence. The recorded information has been reviewed and is accurate.

## 2013-08-24 NOTE — Progress Notes (Deleted)
Subjective:   This chart was scribed for Collene GobbleSteven A Daub, MD by Arlan OrganAshley Leger, [facility]. This patient was seen in room Room/bed info not found and the patient's care was started 10:43 AM.    Patient ID: [name] S Hinesley, male    DOB: 06/10/1960, 53 y.o.   MRN: [id number]  No chief complaint on file.  HPI  HPI Comments: [name] S Empson is a 53 y.o. male who [facility] a complete physical examination today.  Patient Active Problem List   Diagnosis Date Noted   Postlaminectomy syndrome, cervical region 04/12/2013   Other chronic pain 02/06/2013   Chronic right shoulder pain 12/30/2012   Hyperlipidemia 10/11/2011   Gout 10/11/2011   Acne 10/11/2011   Past Medical History  Diagnosis Date   Hyperlipidemia     takes Simvastatin daily   Arthritis    Joint pain    Chronic neck pain    History of kidney stones     last time 3746yrs ago   Gout     takes Allopurinol daily and Colchicine daily   Sleep apnea     requested sleep study from [facility]   Allergy    Past Surgical History  Procedure Laterality Date   Shoulder surgery  1567yrs ago and then 2013    x 2   Carpal tunnel release  2008    right   Cervical fusion  2003   Knee arthroscopy  15+yrs ago    right    Vasectomy  65146yrs ago   Shoulder surgery  2367yrs ago    left    Colonosocpy     Circumcision  126yrs   Anterior cervical decomp/discectomy fusion  11/19/2011    Procedure: ANTERIOR CERVICAL DECOMPRESSION/DISCECTOMY FUSION 1 LEVEL;  Surgeon: Emilee HeroMark Leonard Dumonski, MD;  Location: [facility] OR;  Service: Orthopedics;  Laterality: Right;  Anterior cervical decompression fusion, cervical 4-5 with instrumentation and allograft.   No Known Allergies Prior to Admission medications   Medication Sig Start Date End Date Taking? Authorizing Provider  aspirin 81 MG tablet Take 81 mg by mouth daily.    Historical Provider, MD  atorvastatin (LIPITOR) 40 MG  tablet Take 1 tablet (40 mg total) by mouth daily. 07/15/12   Collene GobbleSteven A Daub, MD  b complex vitamins tablet Take 1 tablet by mouth daily.    Historical Provider, MD  cholecalciferol (VITAMIN D) 400 UNITS TABS Take 400 Units by mouth daily.     Historical Provider, MD  Omega-3 Fatty Acids (FISH OIL) 1200 MG CAPS Take 1,200 mg by mouth daily.    Historical Provider, MD   History   Social History   Marital Status: Married    Spouse Name: N/A    Number of Children: N/A   Years of Education: N/A   Occupational History   Not on file.   Social History Main Topics   Smoking status: Never Smoker    Smokeless tobacco: Not on file   Alcohol Use: No   Drug Use: No   Sexual Activity: Yes    Partners: Female   Other Topics Concern   Not on file   Social History Narrative   No narrative on file     Review of Systems  Constitutional: Negative.   HENT: Negative.   Eyes: Negative.   Respiratory: Negative.   Cardiovascular: Negative.   Gastrointestinal: Negative.   Endocrine: Negative.   Genitourinary: Negative.   Musculoskeletal: Positive  for arthralgias, neck pain and neck stiffness.  Skin: Negative.   Allergic/Immunologic: Negative.   Neurological: Negative.   Hematological: Negative.   Psychiatric/Behavioral: Negative.      Objective:  Physical Exam  CONSTITUTIONAL: Well developed/well nourished HEAD: Normocephalic/atraumatic EYES: EOMI/PERRL ENMT: Mucous membranes moist NECK: supple no meningeal signs SPINE:entire spine nontender CV: S1/S2 noted, no murmurs/rubs/gallops noted LUNGS: Lungs are clear to auscultation bilaterally, no apparent distress ABDOMEN: soft, nontender, no rebound or guarding GU:no cva tenderness NEURO: Pt is awake/alert, moves all extremitiesx4 EXTREMITIES: pulses normal, full ROM SKIN: warm, color normal PSYCH: no abnormalities of mood noted      Assessment & Plan:   I personally performed the services described in this  documentation, which was scribed in my presence. The recorded information has been reviewed and is accurate.

## 2013-08-25 LAB — PSA: PSA: 0.98 ng/mL (ref <=4.00)

## 2013-08-29 ENCOUNTER — Other Ambulatory Visit: Payer: Self-pay | Admitting: Radiology

## 2013-08-29 DIAGNOSIS — E785 Hyperlipidemia, unspecified: Secondary | ICD-10-CM

## 2013-08-29 MED ORDER — ATORVASTATIN CALCIUM 20 MG PO TABS: 20.0000 mg | ORAL_TABLET | Freq: Every day | ORAL | Status: DC

## 2014-12-18 ENCOUNTER — Encounter: Payer: Self-pay | Admitting: Gastroenterology

## 2017-11-25 DIAGNOSIS — G8929 Other chronic pain: Secondary | ICD-10-CM | POA: Insufficient documentation

## 2017-11-25 DIAGNOSIS — M25511 Pain in right shoulder: Secondary | ICD-10-CM | POA: Insufficient documentation

## 2021-03-13 DIAGNOSIS — M899 Disorder of bone, unspecified: Secondary | ICD-10-CM | POA: Insufficient documentation

## 2021-03-13 DIAGNOSIS — Z789 Other specified health status: Secondary | ICD-10-CM | POA: Insufficient documentation

## 2021-03-13 DIAGNOSIS — G894 Chronic pain syndrome: Secondary | ICD-10-CM | POA: Insufficient documentation

## 2021-03-13 DIAGNOSIS — Z79899 Other long term (current) drug therapy: Secondary | ICD-10-CM | POA: Insufficient documentation

## 2021-03-13 NOTE — Progress Notes (Signed)
Patient: [name]  Service Category: E/M  Provider: Gaspar Cola, MD  DOB: Oct 06, 1960  DOS: 03/17/2021  Referring Provider: Orma Flaming, MD  MRN: [id number]  Setting: Ambulatory outpatient  PCP: [name] (Inactive)  Type: New Patient  Specialty: Interventional Pain Management    Location: Office  Delivery: Face-to-face     Primary Reason(s) for Visit: Encounter for initial evaluation of one or more chronic problems (new to examiner) potentially causing chronic pain, and posing a threat to normal musculoskeletal function. (Level of risk: High) CC: Hip Pain (right) and Back Pain (Low and center)  HPI  [name] is a 61 y.o. year old, male patient, who comes for the first time to our practice referred by Glenn Flaming, MD for our initial evaluation of his chronic pain. He has Hyperlipidemia; Gout; Acne; Chronic shoulder pain (Right); Chronic low back pain (2ry area of Pain) (Midline) w/o sciatica; Postlaminectomy syndrome, cervical region; Chronic pain syndrome; Pharmacologic therapy; Disorder of skeletal system; Problems influencing health status; Chronic knee pain (5th area of Pain) (Right); Chronic hip pain (1ry area of Pain) (Right); Enthesopathy of hip region (Right); Chronic groin pain (Right); Chronic low back pain (Right) w/o sciatica; Lumbar facet syndrome (Bilateral) (R>L); Chronic shoulder pain (3ry area of Pain) (Bilateral) (R>L); Chronic neck pain with history of cervical spinal surgery (4th area of Pain); Trochanteric bursitis of hip (Right); Complex renal cyst; Impotence; Primary osteoarthritis of right knee; Carpal tunnel syndrome; Cervical radiculopathy; Degenerative joint disease of shoulder (Right); Encounter for surgical aftercare following surgery on the nervous system; Enlarged prostate; Gross hematuria; Impingement syndrome of shoulder region; Kidney stone; Lesion of ulnar nerve, right upper limb; Male erectile dysfunction, unspecified; Primary erectile dysfunction;  Obesity; Obstructive sleep apnea (adult) (pediatric); Obstructive sleep apnea of adult; Sprain of sacroiliac joint; Osteoarthritis of hip (Right); Unspecified disorder of refraction; Vitamin D deficiency; Pain in right hip; Encounter for other preprocedural examination; Encounter for immunization; Counseling, unspecified; Pain in left shoulder; Pain in right elbow; Gluteal tendinopathy (Right); DDD (degenerative disc disease), lumbosacral; Lumbar facet hypertrophy (Multilevel) (Bilateral); and 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 Today he comes in for evaluation of his Hip Pain (right) and Back Pain (Low and center)  Pain Assessment: Location: Right Hip Radiating: low back and right upper thigh and groin at times Onset: More than a month ago Duration: Chronic pain Quality: Sharp, Stabbing, Numbness, Pressure, Tightness, Aching Severity: 3 /10 (subjective, self-reported pain score)  Effect on ADL:   Timing: Intermittent Modifying factors: rest, positioning BP: (!) 138/91   HR: 68  Onset and Duration: Gradual and Date of onset: 3 years ago Cause of pain: Unknown Severity: Getting worse, NAS-11 at its worse: 8/10, NAS-11 at its best: 3/10, NAS-11 now: 5/10, and NAS-11 on the average: 5/10 Timing: During activity or exercise and After activity or exercise Aggravating Factors: Bending, Climbing, Kneeling, Motion, Prolonged sitting, Squatting, Walking, Walking uphill, and Walking downhill Alleviating Factors: Resting and Sitting Associated Problems: Weakness and Pain that does not allow patient to sleep Quality of Pain: Aching, Annoying, Constant, Intermittent, Getting longer, Nagging, Sharp, and Stabbing Previous Examinations or Tests: MRI scan, X-rays, and Orthopedic evaluation Previous Treatments: Physical Therapy and Trigger point injections  According to the patient and primary area of pain is that of the hip (Right).  The patient indicated  that the pain started slowly and he cannot remember any type of trauma that may have triggered it.  He seems to  think that he might of come from some stretching exercises that he was doing some time before it started.  The patient denies any prior hip surgeries but he does admit having had 2 hip injections, the first of which was a fluoroscopic guided injection and the other one was described as having been similar to a trigger point injection.  The patient indicated absolutely no relief with those injections.  He does admit to having had some physical therapy which seems to have helped to some degree.  He also has an MRI of the hip however, none of the official reports from any of the studies done at the [place] are available within the electronic medical record.  Looking through the notes attached to the referral there appears to be a report of an x-ray of the right hip done in November 03, 2019 that was read as a normal exam.  No significant arthropathy.  Negative for fracture versus luxation.  AP view appears to be unremarkable.  There also appears to be an MRI report of the right hip dated February 14, 2020.  The impression on this MRI reads: No acute osseous abnormality or advanced right hip degenerative changes.  Potential mild greater trochanteric bursitis and gluteal insertional tendinopathy.  The patient's secondary area pain is that of the lower back (Midline) (ML>R).  The patient indicated that this pain started after the right hip pain and it also tends to radiate towards the right hip and right thigh.  He denies any prior back surgeries but does admit to physical therapy which did not help and in fact worsened the pain.  He does have an MRI which apparently was done on February 15, 2020.  The MRI reveals a large "known" left renal cyst.  According to the MRI at the L3-4 level there is bilateral facet hypertrophy with moderate bilateral foraminal stenosis.  At the L4-5 level there is a disc bulge with a  central fissure, bilateral facet hypertrophy.  Mild to moderate bilateral foraminal stenosis.  At the L5-S1 level there is an eccentric left-sided disc/osteophyte resulting in mild left foraminal stenosis.  The patient denies any prior injections or nerve blocks in the lumbar region.  The patient's third area of pain is that of the shoulders (Bilateral) (R>L).  The patient indicates having had several shoulder surgeries.  On the right side he has had 2 shoulder surgeries and on the left he has had one.  He also admits to having had shoulder joint injections and he was informed that there was not much that the orthopedic surgeons at [facility] could do for his shoulder shoulder than when the time comes perhaps do a replacement.  He denies any recent x-rays but he does admit having had physical therapy which he describes that not having been helpful.  The patient's fourth area pain is that of the neck (posterior) (Bilateral).  He refers having had 2 prior neck surgeries more than 10 years ago.  He denies any recent x-rays, nerve blocks, or physical therapy for the neck pain.  The patient's fifth area pain is that of the right knee.  Occasionally he will have to wear a brace.  He refers having had 1 prior knee surgery close to 30 years ago.  He denies any prior knee joint injections or nerve blocks.  He denies any recent x-rays or physical therapy.  Physical exam: The patient was able to toe walk and heel walk without any problems.  He was also able to flex  his lumbar spine with good range of motion almost been able to touch the floor.  However hyperextension on rotation of the lumbar spine and the provocative Kemp maneuver did reproduce the patient's low back pain suggesting facet etiology.  Provocative Patrick maneuver was positive bilaterally for hip joint arthralgia with the right side being worse than the left.  He also presented with decreased range of motion of both hips with the right being worse than the left.   The maneuver was negative bilaterally for sacroiliac joint arthralgia.  In the case of the hip pain on the right side he indicated that most of the pain is in the posterior aspect of the hip joint confirming the MRI suggestion of gluteal tendinopathy.  Today I took the time to provide the patient with information regarding my pain practice. The patient was informed that my practice is divided into two sections: an interventional pain management section, as well as a completely separate and distinct medication management section. I explained that I have procedure days for my interventional therapies, and evaluation days for follow-ups and medication management. Because of the amount of documentation required during both, they are kept separated. This means that there is the possibility that he may be scheduled for a procedure on one day, and medication management the next. I have also informed him that because of staffing and facility limitations, I no longer take patients for medication management only. To illustrate the reasons for this, I gave the patient the example of surgeons, and how inappropriate it would be to refer a patient to his/her care, just to write for the post-surgical antibiotics on a surgery done by a different surgeon.   Because interventional pain management is my board-certified specialty, the patient was informed that joining my practice means that they are open to any and all interventional therapies. I made it clear that this does not mean that they will be forced to have any procedures done. What this means is that I believe interventional therapies to be essential part of the diagnosis and proper management of chronic pain conditions. Therefore, patients not interested in these interventional alternatives will be better served under the care of a different practitioner.  The patient was also made aware of my Comprehensive Pain Management Safety Guidelines where by joining my  practice, they limit all of their nerve blocks and joint injections to those done by our practice, for as long as we are retained to manage their care.   Historic Controlled Substance Pharmacotherapy Review  PMP and historical list of controlled substances: Pregabalin 225 mg capsule, 1 cap p.o. twice daily; oxycodone IR 5 mg, 1 tab p.o. 4 times daily (last filled on 03/19/2020) Current opioid analgesics: None MME/day: 0 mg/day  Historical Monitoring: The patient  reports no history of drug use. List of all UDS Test(s): No results found for: MDMA, COCAINSCRNUR, [place], [place], CANNABQUANT, THCU, [place] List of other Serum/Urine Drug Screening Test(s):  No results found for: AMPHSCRSER, BARBSCRSER, BENZOSCRSER, COCAINSCRSER, COCAINSCRNUR, PCPSCRSER, PCPQUANT, THCSCRSER, THCU, CANNABQUANT, OPIATESCRSER, OXYSCRSER, PROPOXSCRSER, ETH Historical Background Evaluation: [place] PMP: PDMP reviewed during this encounter. Online review of the past 69-monthperiod conducted.             PMP NARX Score Report:  Narcotic: 170 Sedative: 320 Stimulant: 000 [place] Department of public safety, offender search: (Editor, commissioningInformation) Non-contributory Risk Assessment Profile: Aberrant behavior: None observed or detected today Risk factors for fatal opioid overdose: None identified today PMP NARX Overdose Risk Score: 130 Fatal  overdose hazard ratio (HR): Calculation deferred Non-fatal overdose hazard ratio (HR): Calculation deferred Risk of opioid abuse or dependence: 0.7-3.0% with doses ? 36 MME/day and 6.1-26% with doses ? 120 MME/day. Substance use disorder (SUD) risk level: See below Personal History of Substance Abuse (SUD-Substance use disorder):  Alcohol:    Illegal Drugs:    Rx Drugs:    ORT Risk Level calculation:    ORT Scoring interpretation table:  Score <3 = Low Risk for SUD  Score between 4-7 = Moderate Risk for SUD  Score >8 = High Risk for Opioid Abuse   PHQ-2 Depression Scale:  Total score:  0  PHQ-2 Scoring interpretation table: (Score and probability of major depressive disorder)  Score 0 = No depression  Score 1 = 15.4% Probability  Score 2 = 21.1% Probability  Score 3 = 38.4% Probability  Score 4 = 45.5% Probability  Score 5 = 56.4% Probability  Score 6 = 78.6% Probability   PHQ-9 Depression Scale:  Total score: 0  PHQ-9 Scoring interpretation table:  Score 0-4 = No depression  Score 5-9 = Mild depression  Score 10-14 = Moderate depression  Score 15-19 = Moderately severe depression  Score 20-27 = Severe depression (2.4 times higher risk of SUD and 2.89 times higher risk of overuse)   Pharmacologic Plan: As per protocol, I have not taken over any controlled substance management, pending the results of ordered tests and/or consults.            Initial impression: Pending review of available data and ordered tests.  Meds   Current Outpatient Medications:    b complex vitamins tablet, Take 1 tablet by mouth daily., Disp: , Rfl:    calcium citrate (CALCITRATE - DOSED IN MG ELEMENTAL CALCIUM) 950 (200 Ca) MG tablet, Take 200 mg of elemental calcium by mouth daily., Disp: , Rfl:    cetirizine (ZYRTEC) 10 MG tablet, Take 10 mg by mouth daily., Disp: , Rfl:    cholecalciferol (VITAMIN D) 400 UNITS TABS, Take 400 Units by mouth daily. , Disp: , Rfl:    clindamycin (CLEOCIN T) 1 % lotion, Apply topically daily., Disp: , Rfl:    ipratropium (ATROVENT) 0.06 % nasal spray, Place 2 sprays into both nostrils 4 (four) times daily., Disp: , Rfl:    ketotifen (ZADITOR) 0.025 % ophthalmic solution, 1 drop as needed., Disp: , Rfl:    montelukast (SINGULAIR) 10 MG tablet, Take 10 mg by mouth at bedtime., Disp: , Rfl:    Omega-3 Fatty Acids (FISH OIL) 1200 MG CAPS, Take 1,200 mg by mouth daily., Disp: , Rfl:    pregabalin (LYRICA) 225 MG capsule, Take 225 mg by mouth 2 (two) times daily., Disp: , Rfl:    sildenafil (VIAGRA) 100 MG tablet, Take 100 mg by mouth as needed for erectile  dysfunction., Disp: , Rfl:   Imaging Review  Cervical Imaging: Cervical CT wo contrast: Results for orders placed during the hospital encounter of 02/19/07 CT Cervical Spine Wo Contrast  Narrative Clinical Data: Previous cervical fusion. Right shoulder and arm pain. Assess for nonunion. MRI CERVICAL SPINE WITHOUT CONTRAST: Technique:  Multiplanar and multiecho pulse sequences of the cervical spine, to include the craniocervical junction and cervicothoracic junction, were obtained according to standard protocol without IV contrast. Findings: There is no abnormality of the foramen magnum. There is ordinary mild degenerative change between the anterior arch of C-1 and the dens. No encroachment upon the neural spaces. At C2-3, C3-4, and C4-5, there are minimal  uncovertebral prominences but no significant narrowing of the canal or foramina demonstrated. No facet arthropathy. From C-5 to C-7, the patient has had previous anterior cervical discectomy and fusion. There is an anterior plate with screws at each level. There is no periscrew lucency. Bony fusion between C-6 and C-7 is solid and extensive. Between C-5 and C-6, much of the intervertebral space is not fused, but I do not think that there are a few areas where fusion may be solid. Absence of nitrogen gas between C-5 and C-6 and absence of lucency around the screws also argues for solid fusion. Foramina appear sufficiently patent at both levels. At C7-T1, there is minimal facet degeneration but no slippage or significant encroachment upon the neural spaces. The T1-2 level appears unremarkable.  Impression 1. In the fusion segment from C-5 to C-7, the canal and foramina appear widely patent. Fusion appears solid at the C6-7 level. At C5-6, there is not solid fusion across much of the intervertebral space however, there are a few foci where fusion does appear solid. Also arguing in favor of solid fusion at this level is an absence of nitrogen gas  between the two bones and absence of periscrew lucency. 2. Minimal uncovertebral degenerative changes above and below the fusion but without significant encroachment upon the neural spaces. Minimal facet degeneration at C7-T1 but without slippage.  Provider: Lorrin Jackson  Cervical DG 1 view: Results for orders placed during the hospital encounter of 11/19/11 DG Cervical Spine 1 View  Narrative *RADIOLOGY REPORT*  Clinical Data: Anterior fusion  DG CERVICAL SPINE - 1 VIEW,DG C-ARM 1-60 MIN  Comparison: MR 10/17/2011  Findings: A single intraoperative C-arm fluoroscopic spot radiograph documents changes of instrumented ACDF C4-5.  Inferior fusion hardware is partially seen.  IMPRESSION:  1.  ACDF C4-5   Original Report Authenticated By: Trecia Rogers, M.D.  Cervical DG 2-3 views: Results for orders placed in visit on 10/11/11 DG Cervical Spine 2-3 Views  Narrative *RADIOLOGY REPORT*  Clinical Data: Right shoulder pain  CERVICAL SPINE - 2-3 VIEW  Comparison: None.  Findings: Previous instrumented ACDF C5-C7, hardware intact without surrounding lucency.  Straightening of the normal cervical lordosis.  Mild narrowing C4-5 with anterior endplate osteophytes. Calcification in the anterior longitudinal ligament at C2-3. Negative for fracture, dislocation, or other acute bony abnormality.  Multiple dental restorations.  IMPRESSION:  1.  Negative for fracture or other acute bony abnormality. 2.  ACDF C5-7 with early adjacent level disease C4-5. 3. Loss of the normal cervical spine lordosis, which may be secondary to positioning, spasm, or soft tissue injury.  Clinically significant discrepancy from primary report, if provided: None  Original Report Authenticated By: Trecia Rogers, M.D.  Lumbosacral Imaging: Lumbar DG Epidurogram OP: Results for orders placed during the hospital encounter of 07/27/07 DG Epidurography  Narrative RADIOLOGY  REPORT  EPIDUROGRAM S+I  Clinical Data: Neck pain no real improvement with transforaminal injections.  We perform a nonselective injection today  Procedure:  Right C7-T1 Cervical Interlaminar Epidural Steroid Injection  Fully informed written consent was obtained on the first visit. The patient was placed prone on the fluoroscopic table, the C7-T1 interspace was visualized, and the skin was marked.  After thorough povidone-iodine scrubbing, the site was draped in sterile fashion. 1% lidocaine was used to anesthetize the skin and deeper soft tissues.  A 20-gauge Crawford epidural needle was inserted and advanced into the epidural space using loss of resistance technique with normal saline.  The epidural space  was identified on first pass without evidence of blood, cerebrospinal fluid, or paresthesias.  Needle tip placement within the epidural space was confirmed using 1 mL of non-ionic contrast.  There was no intravascular or subarachnoid opacification.  Next, 47m of Kenalog was administered and flushed using 2 mL of normal saline.  The needle was restyletted and withdrawn.  The patient tolerated the procedure well and there was no evidence of procedural complication.  The patient was able to ambulate to the recovery area and observed for an appropriate length of time as outlined on the nursing notes. The patient was discharged home with a driver in good condition with detailed patient instructions.  Fluoroscopy time:  50 seconds  IMPRESSION:  Satisfactory interlaminar cervical epidural steroid injection, right C7-T1, #3.  This completes the series of injections in this patient.  Provider: SDawayne Cirri Complexity Note: Imaging results reviewed. Results shared with Mr. MPenley using Layman's terms.                        ROS  Cardiovascular: No reported cardiovascular signs or symptoms such as High blood pressure, coronary artery disease, abnormal heart rate or rhythm,  heart attack, blood thinner therapy or heart weakness and/or failure Pulmonary or Respiratory: Snoring  and Temporary stoppage of breathing during sleep Neurological: No reported neurological signs or symptoms such as seizures, abnormal skin sensations, urinary and/or fecal incontinence, being born with an abnormal open spine and/or a tethered spinal cord Psychological-Psychiatric: No reported psychological or psychiatric signs or symptoms such as difficulty sleeping, anxiety, depression, delusions or hallucinations (schizophrenial), mood swings (bipolar disorders) or suicidal ideations or attempts Gastrointestinal: No reported gastrointestinal signs or symptoms such as vomiting or evacuating blood, reflux, heartburn, alternating episodes of diarrhea and constipation, inflamed or scarred liver, or pancreas or irrregular and/or infrequent bowel movements Genitourinary: Passing kidney stones Hematological: No reported hematological signs or symptoms such as prolonged bleeding, low or poor functioning platelets, bruising or bleeding easily, hereditary bleeding problems, low energy levels due to low hemoglobin or being anemic Endocrine: No reported endocrine signs or symptoms such as high or low blood sugar, rapid heart rate due to high thyroid levels, obesity or weight gain due to slow thyroid or thyroid disease Rheumatologic: No reported rheumatological signs and symptoms such as fatigue, joint pain, tenderness, swelling, redness, heat, stiffness, decreased range of motion, with or without associated rash Musculoskeletal: Negative for myasthenia gravis, muscular dystrophy, multiple sclerosis or malignant hyperthermia Work History: Retired  Allergies  Mr. MFerberhas No Known Allergies.  Laboratory Chemistry Profile   Renal Lab Results  Component Value Date   BUN 8 03/17/2021   CREATININE 0.99 03/17/2021   BCR 8 (L) 03/17/2021   GFRAA 84 08/24/2013   GFRNONAA 72 08/24/2013   SPECGRAV 1.025  08/24/2013   PHUR 6.0 08/24/2013   PROTEINUR 30 08/24/2013     Electrolytes Lab Results  Component Value Date   NA 142 03/17/2021   K 4.5 03/17/2021   CL 103 03/17/2021   CALCIUM 9.9 03/17/2021   MG 2.2 03/17/2021     Hepatic Lab Results  Component Value Date   AST 32 03/17/2021   ALT 16 08/24/2013   ALBUMIN 4.3 03/17/2021   ALKPHOS 70 03/17/2021     ID Lab Results  Component Value Date   STAPHAUREUS NEGATIVE 11/17/2011   MRSAPCR NEGATIVE 11/17/2011     Bone Lab Results  Component Value Date   VD25OH 29 (L) 07/14/2012  25OHVITD1 WILL FOLLOW 03/17/2021   25OHVITD2 WILL FOLLOW 03/17/2021   25OHVITD3 WILL FOLLOW 03/17/2021   TESTOFREE 87.2 07/14/2012   TESTOSTERONE 352 07/14/2012     Endocrine Lab Results  Component Value Date   GLUCOSE 73 03/17/2021   GLUCOSEU NEGATIVE 11/17/2011   TSH 0.852 07/14/2012   TESTOFREE 87.2 07/14/2012   TESTOSTERONE 352 07/14/2012   SHBG 22 07/14/2012     Neuropathy Lab Results  Component Value Date   VITAMINB12 588 03/17/2021     CNS No results found for: COLORCSF, APPEARCSF, RBCCOUNTCSF, WBCCSF, POLYSCSF, LYMPHSCSF, EOSCSF, PROTEINCSF, GLUCCSF, JCVIRUS, CSFOLI, IGGCSF, LABACHR, ACETBL, LABACHR, ACETBL   Inflammation (CRP: Acute   ESR: Chronic) Lab Results  Component Value Date   CRP <1 03/17/2021   ESRSEDRATE 9 03/17/2021     Rheumatology Lab Results  Component Value Date   LABURIC 6.4 08/24/2013     Coagulation Lab Results  Component Value Date   INR 1.12 11/17/2011   LABPROT 14.6 11/17/2011   APTT 31 11/17/2011   PLT 171 08/24/2013     Cardiovascular Lab Results  Component Value Date   HGB 14.7 08/24/2013   HCT 43.9 08/24/2013     Screening Lab Results  Component Value Date   STAPHAUREUS NEGATIVE 11/17/2011   MRSAPCR NEGATIVE 11/17/2011     Cancer No results found for: CEA, CA125, LABCA2   Allergens No results found for: ALMOND, APPLE, ASPARAGUS, AVOCADO, BANANA, BARLEY, BASIL, BAYLEAF,  GREENBEAN, LIMABEAN, WHITEBEAN, BEEFIGE, REDBEET, BLUEBERRY, BROCCOLI, CABBAGE, MELON, CARROT, CASEIN, CASHEWNUT, CAULIFLOWER, CELERY     Note: Lab results reviewed.  PFSH  Drug: Mr. Pellum  reports no history of drug use. Alcohol:  reports no history of alcohol use. Tobacco:  reports that he has never smoked. He does not have any smokeless tobacco history on file. Medical:  has a past medical history of Allergy, Arthritis, Chronic neck pain, Gout, History of kidney stones, Hyperlipidemia, Joint pain, and Sleep apnea. Family: family history includes Cancer in his mother and sister.  Past Surgical History:  Procedure Laterality Date   ANTERIOR CERVICAL DECOMP/DISCECTOMY FUSION  11/19/2011   Procedure: ANTERIOR CERVICAL DECOMPRESSION/DISCECTOMY FUSION 1 LEVEL;  Surgeon: Sinclair Ship, MD;  Location: [facility];  Service: Orthopedics;  Laterality: Right;  Anterior cervical decompression fusion, cervical 4-5 with instrumentation and allograft.   CARPAL TUNNEL RELEASE  2008   right   CERVICAL FUSION  2003   CIRCUMCISION  12yr   colonosocpy     KNEE ARTHROSCOPY  15+yrs ago   right    SHOULDER SURGERY  89yrago and then 2013   x 2   SHOULDER SURGERY  [id number]   left    VASECTOMY  [id number]   Active Ambulatory Problems    Diagnosis Date Noted   Hyperlipidemia 10/11/2011   Gout 10/11/2011   Acne 10/11/2011   Chronic shoulder pain (Right) 12/30/2012   Chronic low back pain (2ry area of Pain) (Midline) w/o sciatica 02/06/2013   Postlaminectomy syndrome, cervical region 04/12/2013   Chronic pain syndrome 03/13/2021   Pharmacologic therapy 03/13/2021   Disorder of skeletal system 03/13/2021   Problems influencing health status 03/13/2021   Chronic knee pain (5th area of Pain) (Right) 11/25/2017   Chronic hip pain (1ry area of Pain) (Right) 03/17/2021   Enthesopathy of hip region (Right) 03/17/2021   Chronic groin pain (Right) 03/17/2021   Chronic low back pain (Right) w/o sciatica  03/17/2021   Lumbar facet syndrome (Bilateral) (R>L) 03/17/2021   Chronic  shoulder pain (3ry area of Pain) (Bilateral) (R>L) 03/17/2021   Chronic neck pain with history of cervical spinal surgery (4th area of Pain) 03/17/2021   Trochanteric bursitis of hip (Right) 03/18/2021   Complex renal cyst 03/18/2021   Impotence 03/18/2021   Primary osteoarthritis of right knee 03/18/2021   Carpal tunnel syndrome 03/18/2021   Cervical radiculopathy 03/18/2021   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   Encounter for surgical aftercare following surgery on the nervous system 03/18/2021   Enlarged prostate 03/18/2021   Gross hematuria 03/18/2021   Impingement syndrome of shoulder region 03/18/2021   Kidney stone 03/18/2021   Lesion of ulnar nerve, right upper limb 03/18/2021   Male erectile dysfunction, unspecified 03/18/2021   Primary erectile dysfunction 03/18/2021   Obesity 03/18/2021   Obstructive sleep apnea (adult) (pediatric) 03/18/2021   Obstructive sleep apnea of adult 03/18/2021   Sprain of sacroiliac joint 03/18/2021   Osteoarthritis of hip (Right) 03/18/2021   Unspecified disorder of refraction 03/18/2021   Vitamin D deficiency 03/18/2021   Pain in right hip 03/18/2021   Encounter for other preprocedural examination 03/18/2021   Encounter for immunization 03/18/2021   Counseling, unspecified 03/18/2021   Pain in left shoulder 03/18/2021   Pain in right elbow 03/18/2021   Gluteal tendinopathy (Right) 03/18/2021   DDD (degenerative disc disease), lumbosacral 03/18/2021   Lumbar facet hypertrophy (Multilevel) (Bilateral) 03/18/2021   Lumbar foraminal stenosis (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, L4-5) (Left: L5-S1) 03/18/2021   Resolved Ambulatory Problems    Diagnosis Date Noted   No Resolved Ambulatory Problems   Past Medical History:  Diagnosis Date   Allergy    Arthritis    Chronic neck pain    History of kidney stones    Joint pain    Sleep apnea    Constitutional  Exam  General appearance: Well nourished, well developed, and well hydrated. In no apparent acute distress Vitals:   03/17/21 1416 03/17/21 1417  BP:  (!) 138/91  Pulse:  68  Resp:  16  Temp:  (!) 97.5 F (36.4 C)  SpO2:  100%  Weight: 211 lb (95.7 kg)   Height: 5' 10.5" (1.791 m)    BMI Assessment: Estimated body mass index is 29.85 kg/m as calculated from the following:   Height as of this encounter: 5' 10.5" (1.791 m).   Weight as of this encounter: 211 lb (95.7 kg).  BMI interpretation table: BMI level Category Range association with higher incidence of chronic pain  <18 kg/m2 Underweight   18.5-24.9 kg/m2 Ideal body weight   25-29.9 kg/m2 Overweight Increased incidence by 20%  30-34.9 kg/m2 Obese (Class I) Increased incidence by 68%  35-39.9 kg/m2 Severe obesity (Class II) Increased incidence by 136%  >40 kg/m2 Extreme obesity (Class III) Increased incidence by 254%   Patient's current BMI Ideal Body weight  Body mass index is 29.85 kg/m. Ideal body weight: 74.1 kg (163 lb 7.5 oz) Adjusted ideal body weight: 82.8 kg (182 lb 7.7 oz)   BMI Readings from Last 4 Encounters:  03/17/21 29.85 kg/m  08/24/13 33.81 kg/m  06/29/13 35.05 kg/m  05/18/13 36.01 kg/m   Wt Readings from Last 4 Encounters:  03/17/21 211 lb (95.7 kg)  08/24/13 239 lb (108.4 kg)  06/29/13 247 lb 12.8 oz (112.4 kg)  05/18/13 251 lb (113.9 kg)    Psych/Mental status: Alert, oriented x 3 (person, place, & time)       Eyes: PERLA Respiratory: No evidence of acute respiratory distress  Assessment  Primary Diagnosis & Pertinent Problem List: The primary encounter diagnosis was Chronic pain syndrome. Diagnoses of Chronic hip pain (1ry area of Pain) (Right), Osteoarthritis of hip (Right), Trochanteric bursitis of hip (Right), Enthesopathy of hip region (Right), Gluteal tendinopathy (Right), Chronic groin pain (Right), Chronic low back pain (2ry area of Pain) (Midline) w/o sciatica, Chronic low back  pain (Right) w/o sciatica, Lumbar facet syndrome (Bilateral) (R>L), Chronic shoulder pain (3ry area of Pain) (Bilateral) (R>L), Chronic neck pain with history of cervical spinal surgery (4th area of Pain), Chronic knee pain (5th area of Pain) (Right), Pharmacologic therapy, Disorder of skeletal system, Problems influencing health status, DDD (degenerative disc disease), lumbosacral, Lumbar facet hypertrophy (Multilevel) (Bilateral), and Lumbar foraminal stenosis (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, L4-5) (Left: L5-S1) were also pertinent to this visit.  Visit Diagnosis (New problems to examiner): 1. Chronic pain syndrome   2. Chronic hip pain (1ry area of Pain) (Right)   3. Osteoarthritis of hip (Right)   4. Trochanteric bursitis of hip (Right)   5. Enthesopathy of hip region (Right)   6. Gluteal tendinopathy (Right)   7. Chronic groin pain (Right)   8. Chronic low back pain (2ry area of Pain) (Midline) w/o sciatica   9. Chronic low back pain (Right) w/o sciatica   10. Lumbar facet syndrome (Bilateral) (R>L)   11. Chronic shoulder pain (3ry area of Pain) (Bilateral) (R>L)   12. Chronic neck pain with history of cervical spinal surgery (4th area of Pain)   13. Chronic knee pain (5th area of Pain) (Right)   14. Pharmacologic therapy   15. Disorder of skeletal system   16. Problems influencing health status   17. DDD (degenerative disc disease), lumbosacral   18. Lumbar facet hypertrophy (Multilevel) (Bilateral)   19. Lumbar foraminal stenosis (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, L4-5) (Left: L5-S1)    Plan of Care (Initial workup plan)  Note: Mr. Driggers was reminded that as per protocol, today's visit has been an evaluation only. We have not taken over the patient's controlled substance management.  Problem-specific plan: No problem-specific Assessment & Plan notes found for this encounter.  Lab Orders         Compliance Drug Analysis, Ur         Comp. Metabolic Panel (12)         Magnesium         Vitamin B12          Sedimentation rate         25-Hydroxy vitamin D Lcms D2+D3         C-reactive protein     Imaging Orders         DG Cervical Spine With Flex & Extend         DG Shoulder Right         DG Shoulder Left         DG HIP UNILAT W OR W/O PELVIS 2-3 VIEWS RIGHT         DG HIP UNILAT W OR W/O PELVIS 2-3 VIEWS LEFT         DG Knee Complete 4 Views Right         DG Knee Complete 4 Views Left         DG Lumbar Spine Complete W/Bend     Referral Orders  No referral(s) requested today   Procedure Orders         HIP INJECTION     Pharmacotherapy (current): Medications ordered:  No orders of the defined types were placed  in this encounter.  Medications administered during this visit: [name] had no medications administered during this visit.   Pharmacological management options:  Opioid Analgesics: The patient was informed that there is no guarantee that he would be a candidate for opioid analgesics. The decision will be made following CDC guidelines. This decision will be based on the results of diagnostic studies, as well as Mr. Bernhard's risk profile.   Membrane stabilizer: To be determined at a later time  Muscle relaxant: To be determined at a later time  NSAID: To be determined at a later time  Other analgesic(s): To be determined at a later time   Interventional management options: Mr. Rone was informed that there is no guarantee that he would be a candidate for interventional therapies. The decision will be based on the results of diagnostic studies, as well as Mr. Nogueira's risk profile.  Procedure(s) under consideration:  Pending results of ordered studies     Interventional Therapies  Risk   Complexity Considerations:   Estimated body mass index is 29.85 kg/m as calculated from the following:   Height as of this encounter: 5' 10.5" (1.791 m).   Weight as of this encounter: 211 lb (95.7 kg). WNL   Planned   Pending:   Pending further evaluation   Under consideration:    Diagnostic right hip injection targeting the gluteal tendon and trochanteric bursa #1  Diagnostic lumbar facet medial branch block #1  Diagnostic bilateral shoulder joint injections #1  Diagnostic bilateral suprascapular nerve block #1  Diagnostic cervical epidural steroid injection #1  Diagnostic cervical facet medial branch block #1  Diagnostic right knee injection #1  Diagnostic right genicular nerve block #1    Completed:   None at this time   Therapeutic   Palliative (PRN) options:   None established    Provider-requested follow-up: Return for (77mn), (Clinic) procedure: (R) Hip Inj. #1, (Sed-anx).  Future Appointments  Date Time Provider [place] 03/25/2021 10:00 AM NMilinda Pointer MD [facility] None     Note by: FGaspar Cola MD Date: 03/17/2021; Time: 7:43 AM

## 2021-03-17 ENCOUNTER — Other Ambulatory Visit: Payer: Self-pay

## 2021-03-17 ENCOUNTER — Ambulatory Visit
Admission: RE | Admit: 2021-03-17 | Discharge: 2021-03-17 | Disposition: A | Discharge status: Home / Self Care | Payer: No Typology Code available for payment source | Source: Ambulatory Visit | Attending: Pain Medicine | Admitting: Pain Medicine

## 2021-03-17 ENCOUNTER — Ambulatory Visit
Admission: RE | Admit: 2021-03-17 | Discharge: 2021-03-17 | Disposition: A | Discharge status: Home / Self Care | Payer: No Typology Code available for payment source | Attending: Pain Medicine | Admitting: Pain Medicine

## 2021-03-17 ENCOUNTER — Ambulatory Visit: Payer: No Typology Code available for payment source | Attending: Pain Medicine | Admitting: Pain Medicine

## 2021-03-17 ENCOUNTER — Encounter: Payer: Self-pay | Admitting: Pain Medicine

## 2021-03-17 VITALS — BP 138/91 | HR 68 | Temp 97.5°F | Resp 16 | Ht 70.5 in | Wt 211.0 lb

## 2021-03-17 DIAGNOSIS — M25511 Pain in right shoulder: Secondary | ICD-10-CM | POA: Diagnosis present

## 2021-03-17 DIAGNOSIS — Z9889 Other specified postprocedural states: Secondary | ICD-10-CM | POA: Insufficient documentation

## 2021-03-17 DIAGNOSIS — M25512 Pain in left shoulder: Secondary | ICD-10-CM | POA: Diagnosis present

## 2021-03-17 DIAGNOSIS — Z79899 Other long term (current) drug therapy: Secondary | ICD-10-CM

## 2021-03-17 DIAGNOSIS — M51379 Other intervertebral disc degeneration, lumbosacral region without mention of lumbar back pain or lower extremity pain: Secondary | ICD-10-CM

## 2021-03-17 DIAGNOSIS — M5137 Other intervertebral disc degeneration, lumbosacral region: Secondary | ICD-10-CM | POA: Insufficient documentation

## 2021-03-17 DIAGNOSIS — M67951 Unspecified disorder of synovium and tendon, right thigh: Secondary | ICD-10-CM | POA: Diagnosis present

## 2021-03-17 DIAGNOSIS — R1031 Right lower quadrant pain: Secondary | ICD-10-CM

## 2021-03-17 DIAGNOSIS — M7061 Trochanteric bursitis, right hip: Secondary | ICD-10-CM | POA: Diagnosis not present

## 2021-03-17 DIAGNOSIS — M25561 Pain in right knee: Secondary | ICD-10-CM | POA: Insufficient documentation

## 2021-03-17 DIAGNOSIS — M76891 Other specified enthesopathies of right lower limb, excluding foot: Secondary | ICD-10-CM

## 2021-03-17 DIAGNOSIS — M1611 Unilateral primary osteoarthritis, right hip: Secondary | ICD-10-CM

## 2021-03-17 DIAGNOSIS — M25551 Pain in right hip: Secondary | ICD-10-CM

## 2021-03-17 DIAGNOSIS — G8929 Other chronic pain: Secondary | ICD-10-CM | POA: Insufficient documentation

## 2021-03-17 DIAGNOSIS — M47816 Spondylosis without myelopathy or radiculopathy, lumbar region: Secondary | ICD-10-CM

## 2021-03-17 DIAGNOSIS — G8928 Other chronic postprocedural pain: Secondary | ICD-10-CM | POA: Insufficient documentation

## 2021-03-17 DIAGNOSIS — M545 Low back pain, unspecified: Secondary | ICD-10-CM | POA: Diagnosis present

## 2021-03-17 DIAGNOSIS — M899 Disorder of bone, unspecified: Secondary | ICD-10-CM

## 2021-03-17 DIAGNOSIS — G894 Chronic pain syndrome: Secondary | ICD-10-CM

## 2021-03-17 DIAGNOSIS — M542 Cervicalgia: Secondary | ICD-10-CM | POA: Insufficient documentation

## 2021-03-17 DIAGNOSIS — Z789 Other specified health status: Secondary | ICD-10-CM | POA: Diagnosis present

## 2021-03-17 DIAGNOSIS — M48061 Spinal stenosis, lumbar region without neurogenic claudication: Secondary | ICD-10-CM

## 2021-03-17 NOTE — Patient Instructions (Signed)
______________________________________________________________________  Preparing for Procedure with Sedation  NOTICE: Due to recent regulatory changes, starting on October 07, 2020, procedures requiring intravenous (IV) sedation will no longer be performed at the [facility].  These types of procedures are required to be performed at [facility] ambulatory surgery facility.  We are very sorry for the inconvenience.  Procedure appointments are limited to planned procedures: No Prescription Refills. No disability issues will be discussed. No medication changes will be discussed.  Instructions: Oral Intake: Do not eat or drink anything for at least 8 hours prior to your procedure. (Exception: Blood Pressure Medication. See below.) Transportation: A driver is required. You may not drive yourself after the procedure. Blood Pressure Medicine: Do not forget to take your blood pressure medicine with a sip of water the morning of the procedure. If your Diastolic (lower reading) is above 100 mmHg, elective cases will be cancelled/rescheduled. Blood thinners: These will need to be stopped for procedures. Notify our staff if you are taking any blood thinners. Depending on which one you take, there will be specific instructions on how and when to stop it. Diabetics on insulin: Notify the staff so that you can be scheduled 1st case in the morning. If your diabetes requires high dose insulin, take only  of your normal insulin dose the morning of the procedure and notify the staff that you have done so. Preventing infections: Shower with an antibacterial soap the morning of your procedure. Build-up your immune system: Take 1000 mg of Vitamin C with every meal (3 times a day) the day prior to your procedure. Antibiotics: Inform the staff if you have a condition or reason that requires you to take antibiotics before dental procedures. Pregnancy: If you are pregnant, call and cancel the procedure. Sickness: If  you have a cold, fever, or any active infections, call and cancel the procedure. Arrival: You must be in the facility at least 30 minutes prior to your scheduled procedure. Children: Do not bring children with you. Dress appropriately: Bring dark clothing that you would not mind if they get stained. Valuables: Do not bring any jewelry or valuables.  Reasons to call and reschedule or cancel your procedure: (Following these recommendations will minimize the risk of a serious complication.) Surgeries: Avoid having procedures within 2 weeks of any surgery. (Avoid for 2 weeks before or after any surgery). Flu Shots: Avoid having procedures within 2 weeks of a flu shots. (Avoid for 2 weeks before or after immunizations). Barium: Avoid having a procedure within 7-10 days after having had a radiological study involving the use of radiological contrast. (Myelograms, Barium swallow or enema study). Heart attacks: Avoid any elective procedures or surgeries for the initial 6 months after a "Myocardial Infarction" (Heart Attack). Blood thinners: It is imperative that you stop these medications before procedures. Let us know if you if you take any blood thinner.  Infection: Avoid procedures during or within two weeks of an infection (including chest colds or gastrointestinal problems). Symptoms associated with infections include: Localized redness, fever, chills, night sweats or profuse sweating, burning sensation when voiding, cough, congestion, stuffiness, runny nose, sore throat, diarrhea, nausea, vomiting, cold or Flu symptoms, recent or current infections. It is specially important if the infection is over the area that we intend to treat. Heart and lung problems: Symptoms that may suggest an active cardiopulmonary problem include: cough, chest pain, breathing difficulties or shortness of breath, dizziness, ankle swelling, uncontrolled high or unusually low blood pressure, and/or palpitations. If you are    experiencing any of these symptoms, cancel your procedure and contact your primary care physician for an evaluation.  Remember:  Regular Business hours are:  Monday to Thursday 8:00 AM to 4:00 PM  Provider's Schedule: Joniah Bednarski, MD:  Procedure days: Tuesday and Thursday 7:30 AM to 4:00 PM  Bilal Lateef, MD:  Procedure days: Monday and Wednesday 7:30 AM to 4:00 PM ______________________________________________________________________  ____________________________________________________________________________________________  General Risks and Possible Complications  Patient Responsibilities: It is important that you read this as it is part of your informed consent. It is our duty to inform you of the risks and possible complications associated with treatments offered to you. It is your responsibility as a patient to read this and to ask questions about anything that is not clear or that you believe was not covered in this document.  Patient's Rights: You have the right to refuse treatment. You also have the right to change your mind, even after initially having agreed to have the treatment done. However, under this last option, if you wait until the last second to change your mind, you may be charged for the materials used up to that point.  Introduction: Medicine is not an exact science. Everything in Medicine, including the lack of treatment(s), carries the potential for danger, harm, or loss (which is by definition: Risk). In Medicine, a complication is a secondary problem, condition, or disease that can aggravate an already existing one. All treatments carry the risk of possible complications. The fact that a side effects or complications occurs, does not imply that the treatment was conducted incorrectly. It must be clearly understood that these can happen even when everything is done following the highest safety standards.  No treatment: You can choose not to proceed with the  proposed treatment alternative. The "PRO(s)" would include: avoiding the risk of complications associated with the therapy. The "CON(s)" would include: not getting any of the treatment benefits. These benefits fall under one of three categories: diagnostic; therapeutic; and/or palliative. Diagnostic benefits include: getting information which can ultimately lead to improvement of the disease or symptom(s). Therapeutic benefits are those associated with the successful treatment of the disease. Finally, palliative benefits are those related to the decrease of the primary symptoms, without necessarily curing the condition (example: decreasing the pain from a flare-up of a chronic condition, such as incurable terminal cancer).  General Risks and Complications: These are associated to most interventional treatments. They can occur alone, or in combination. They fall under one of the following six (6) categories: no benefit or worsening of symptoms; bleeding; infection; nerve damage; allergic reactions; and/or death. No benefits or worsening of symptoms: In Medicine there are no guarantees, only probabilities. No healthcare provider can ever guarantee that a medical treatment will work, they can only state the probability that it may. Furthermore, there is always the possibility that the condition may worsen, either directly, or indirectly, as a consequence of the treatment. Bleeding: This is more common if the patient is taking a blood thinner, either prescription or over the counter (example: Goody Powders, Fish oil, Aspirin, Garlic, etc.), or if suffering a condition associated with impaired coagulation (example: Hemophilia, cirrhosis of the liver, low platelet counts, etc.). However, even if you do not have one on these, it can still happen. If you have any of these conditions, or take one of these drugs, make sure to notify your treating physician. Infection: This is more common in patients with a compromised  immune system, either due to disease (example:   diabetes, cancer, human immunodeficiency virus [HIV], etc.), or due to medications or treatments (example: therapies used to treat cancer and rheumatological diseases). However, even if you do not have one on these, it can still happen. If you have any of these conditions, or take one of these drugs, make sure to notify your treating physician. Nerve Damage: This is more common when the treatment is an invasive one, but it can also happen with the use of medications, such as those used in the treatment of cancer. The damage can occur to small secondary nerves, or to large primary ones, such as those in the spinal cord and brain. This damage may be temporary or permanent and it may lead to impairments that can range from temporary numbness to permanent paralysis and/or brain death. Allergic Reactions: Any time a substance or material comes in contact with our body, there is the possibility of an allergic reaction. These can range from a mild skin rash (contact dermatitis) to a severe systemic reaction (anaphylactic reaction), which can result in death. Death: In general, any medical intervention can result in death, most of the time due to an unforeseen complication. ____________________________________________________________________________________________  

## 2021-03-17 NOTE — Progress Notes (Signed)
Safety precautions to be maintained throughout the outpatient stay will include: orient to surroundings, keep bed in low position, maintain call bell within reach at all times, provide assistance with transfer out of bed and ambulation.  

## 2021-03-18 DIAGNOSIS — Z23 Encounter for immunization: Secondary | ICD-10-CM | POA: Insufficient documentation

## 2021-03-18 DIAGNOSIS — E669 Obesity, unspecified: Secondary | ICD-10-CM | POA: Insufficient documentation

## 2021-03-18 DIAGNOSIS — N200 Calculus of kidney: Secondary | ICD-10-CM | POA: Insufficient documentation

## 2021-03-18 DIAGNOSIS — S336XXA Sprain of sacroiliac joint, initial encounter: Secondary | ICD-10-CM | POA: Insufficient documentation

## 2021-03-18 DIAGNOSIS — M48061 Spinal stenosis, lumbar region without neurogenic claudication: Secondary | ICD-10-CM | POA: Insufficient documentation

## 2021-03-18 DIAGNOSIS — M7540 Impingement syndrome of unspecified shoulder: Secondary | ICD-10-CM | POA: Insufficient documentation

## 2021-03-18 DIAGNOSIS — M7061 Trochanteric bursitis, right hip: Secondary | ICD-10-CM | POA: Insufficient documentation

## 2021-03-18 DIAGNOSIS — Z719 Counseling, unspecified: Secondary | ICD-10-CM | POA: Insufficient documentation

## 2021-03-18 DIAGNOSIS — N281 Cyst of kidney, acquired: Secondary | ICD-10-CM | POA: Insufficient documentation

## 2021-03-18 DIAGNOSIS — M47816 Spondylosis without myelopathy or radiculopathy, lumbar region: Secondary | ICD-10-CM | POA: Insufficient documentation

## 2021-03-18 DIAGNOSIS — G5621 Lesion of ulnar nerve, right upper limb: Secondary | ICD-10-CM | POA: Insufficient documentation

## 2021-03-18 DIAGNOSIS — M25521 Pain in right elbow: Secondary | ICD-10-CM | POA: Insufficient documentation

## 2021-03-18 DIAGNOSIS — M25512 Pain in left shoulder: Secondary | ICD-10-CM | POA: Insufficient documentation

## 2021-03-18 DIAGNOSIS — R310 Gross hematuria: Secondary | ICD-10-CM | POA: Insufficient documentation

## 2021-03-18 DIAGNOSIS — M25551 Pain in right hip: Secondary | ICD-10-CM | POA: Insufficient documentation

## 2021-03-18 DIAGNOSIS — G5600 Carpal tunnel syndrome, unspecified upper limb: Secondary | ICD-10-CM | POA: Insufficient documentation

## 2021-03-18 DIAGNOSIS — H527 Unspecified disorder of refraction: Secondary | ICD-10-CM | POA: Insufficient documentation

## 2021-03-18 DIAGNOSIS — Z48811 Encounter for surgical aftercare following surgery on the nervous system: Secondary | ICD-10-CM | POA: Insufficient documentation

## 2021-03-18 DIAGNOSIS — M5412 Radiculopathy, cervical region: Secondary | ICD-10-CM | POA: Insufficient documentation

## 2021-03-18 DIAGNOSIS — M1711 Unilateral primary osteoarthritis, right knee: Secondary | ICD-10-CM | POA: Insufficient documentation

## 2021-03-18 DIAGNOSIS — M67951 Unspecified disorder of synovium and tendon, right thigh: Secondary | ICD-10-CM | POA: Insufficient documentation

## 2021-03-18 DIAGNOSIS — N400 Enlarged prostate without lower urinary tract symptoms: Secondary | ICD-10-CM | POA: Insufficient documentation

## 2021-03-18 DIAGNOSIS — M5137 Other intervertebral disc degeneration, lumbosacral region: Secondary | ICD-10-CM | POA: Insufficient documentation

## 2021-03-18 DIAGNOSIS — M19011 Primary osteoarthritis, right shoulder: Secondary | ICD-10-CM | POA: Insufficient documentation

## 2021-03-18 DIAGNOSIS — M1611 Unilateral primary osteoarthritis, right hip: Secondary | ICD-10-CM | POA: Insufficient documentation

## 2021-03-18 DIAGNOSIS — Z01818 Encounter for other preprocedural examination: Secondary | ICD-10-CM | POA: Insufficient documentation

## 2021-03-18 DIAGNOSIS — E559 Vitamin D deficiency, unspecified: Secondary | ICD-10-CM | POA: Insufficient documentation

## 2021-03-18 DIAGNOSIS — G4733 Obstructive sleep apnea (adult) (pediatric): Secondary | ICD-10-CM | POA: Insufficient documentation

## 2021-03-18 DIAGNOSIS — N529 Male erectile dysfunction, unspecified: Secondary | ICD-10-CM | POA: Insufficient documentation

## 2021-03-21 LAB — COMPLIANCE DRUG ANALYSIS, UR

## 2021-03-24 LAB — COMP. METABOLIC PANEL (12)
AST: 32 IU/L (ref 0–40)
Albumin/Globulin Ratio: 1.7 (ref 1.2–2.2)
Albumin: 4.3 g/dL (ref 3.8–4.9)
Alkaline Phosphatase: 70 IU/L (ref 44–121)
BUN/Creatinine Ratio: 8 — ABNORMAL LOW (ref 10–24)
BUN: 8 mg/dL (ref 8–27)
Bilirubin Total: 0.6 mg/dL (ref 0.0–1.2)
Calcium: 9.9 mg/dL (ref 8.6–10.2)
Chloride: 103 mmol/L (ref 96–106)
Creatinine, Ser: 0.99 mg/dL (ref 0.76–1.27)
Globulin, Total: 2.5 g/dL (ref 1.5–4.5)
Glucose: 73 mg/dL (ref 70–99)
Potassium: 4.5 mmol/L (ref 3.5–5.2)
Sodium: 142 mmol/L (ref 134–144)
Total Protein: 6.8 g/dL (ref 6.0–8.5)
eGFR: 87 mL/min/{1.73_m2} (ref >59)

## 2021-03-24 LAB — MAGNESIUM: Magnesium: 2.2 mg/dL (ref 1.6–2.3)

## 2021-03-24 LAB — 25-HYDROXY VITAMIN D LCMS D2+D3
25-Hydroxy, Vitamin D-2: <1 ng/mL
25-Hydroxy, Vitamin D-3: 39 ng/mL
25-Hydroxy, Vitamin D: 39 ng/mL

## 2021-03-24 LAB — C-REACTIVE PROTEIN: CRP: <1 mg/L (ref 0–10)

## 2021-03-24 LAB — VITAMIN B12: Vitamin B-12: 588 pg/mL (ref 232–1245)

## 2021-03-24 LAB — SEDIMENTATION RATE: Sed Rate: 9 mm/hr (ref 0–30)

## 2021-03-25 ENCOUNTER — Ambulatory Visit
Admission: RE | Admit: 2021-03-25 | Discharge: 2021-03-25 | Disposition: A | Discharge status: Home / Self Care | Payer: No Typology Code available for payment source | Source: Ambulatory Visit | Attending: Pain Medicine | Admitting: Pain Medicine

## 2021-03-25 ENCOUNTER — Ambulatory Visit (HOSPITAL_BASED_OUTPATIENT_CLINIC_OR_DEPARTMENT_OTHER): Payer: No Typology Code available for payment source | Admitting: Pain Medicine

## 2021-03-25 ENCOUNTER — Other Ambulatory Visit: Payer: Self-pay

## 2021-03-25 ENCOUNTER — Encounter: Payer: Self-pay | Admitting: Pain Medicine

## 2021-03-25 VITALS — BP 125/91 | HR 64 | Temp 98.1°F | Resp 19 | Ht 71.0 in | Wt 207.0 lb

## 2021-03-25 DIAGNOSIS — M25551 Pain in right hip: Secondary | ICD-10-CM | POA: Diagnosis present

## 2021-03-25 DIAGNOSIS — M67951 Unspecified disorder of synovium and tendon, right thigh: Secondary | ICD-10-CM | POA: Diagnosis present

## 2021-03-25 DIAGNOSIS — M1611 Unilateral primary osteoarthritis, right hip: Secondary | ICD-10-CM | POA: Insufficient documentation

## 2021-03-25 DIAGNOSIS — M7061 Trochanteric bursitis, right hip: Secondary | ICD-10-CM | POA: Diagnosis present

## 2021-03-25 DIAGNOSIS — M76891 Other specified enthesopathies of right lower limb, excluding foot: Secondary | ICD-10-CM

## 2021-03-25 DIAGNOSIS — G8929 Other chronic pain: Secondary | ICD-10-CM

## 2021-03-25 MED ORDER — IOHEXOL 180 MG/ML  SOLN
10.0000 mL | Freq: Once | INTRAMUSCULAR | Status: AC
  Administered 2021-03-25: 5 mL via INTRA_ARTICULAR

## 2021-03-25 MED ORDER — PENTAFLUOROPROP-TETRAFLUOROETH EX AERO
INHALATION_SPRAY | Freq: Once | CUTANEOUS | Status: AC
  Administered 2021-03-25: 1 via TOPICAL
  Filled 2021-03-25: qty 116

## 2021-03-25 MED ORDER — METHYLPREDNISOLONE ACETATE 80 MG/ML IJ SUSP
80.0000 mg | Freq: Once | INTRAMUSCULAR | Status: AC
  Administered 2021-03-25: 80 mg via INTRA_ARTICULAR

## 2021-03-25 MED ORDER — ROPIVACAINE HCL 2 MG/ML IJ SOLN
INTRAMUSCULAR | Status: AC
  Filled 2021-03-25: qty 20

## 2021-03-25 MED ORDER — LIDOCAINE HCL 2 % IJ SOLN
20.0000 mL | Freq: Once | INTRAMUSCULAR | Status: AC
  Administered 2021-03-25: 400 mg

## 2021-03-25 MED ORDER — ROPIVACAINE HCL 2 MG/ML IJ SOLN
9.0000 mL | Freq: Once | INTRAMUSCULAR | Status: AC
  Administered 2021-03-25: 9 mL via INTRA_ARTICULAR

## 2021-03-25 MED ORDER — LIDOCAINE HCL 2 % IJ SOLN
INTRAMUSCULAR | Status: AC
  Filled 2021-03-25: qty 20

## 2021-03-25 MED ORDER — METHYLPREDNISOLONE ACETATE 80 MG/ML IJ SUSP
INTRAMUSCULAR | Status: AC
  Filled 2021-03-25: qty 1

## 2021-03-25 NOTE — Patient Instructions (Signed)

## 2021-03-25 NOTE — Progress Notes (Signed)
PROVIDER NOTE: Interpretation of information contained herein should be left to medically-trained personnel. Specific patient instructions are provided elsewhere under "Patient Instructions" section of medical record. This document was created in part using STT-dictation technology, any transcriptional errors that may result from this process are unintentional.  Patient: [name] Type: Established DOB: 12/26/1960 MRN: [id number] PCP: Rosanne Sack (Inactive)  Service: Procedure DOS: 03/25/2021 Setting: Ambulatory Location: Ambulatory outpatient facility Delivery: Face-to-face Provider: Oswaldo Done, MD Specialty: Interventional Pain Management Specialty designation: 09 Location: Outpatient facility Ref. Prov.: Guest, Ashley Jacobs, MD    Primary Reason for Visit: Interventional Pain Management Treatment. CC: Hip Pain (right)    Procedure #1:  Anesthesia, Analgesia, Anxiolysis:  Type: Intra-Articular Hip Injection #1  Primary Purpose: Diagnostic Region: Posterolateral hip joint area. Level: Lower pelvic and hip joint level. Target Area: Superior aspect of the hip joint cavity, going thru the superior portion of the capsular ligament. Approach: Posterolateral approach. Laterality: Right  Anesthesia: Local (1-2% Lidocaine)  Anxiolysis: None  Sedation: None  Guidance: Fluoroscopy           Position: Lateral Decubitus with bad side up Area Prepped: Entire Posterolateral hip area. DuraPrep (Iodine Povacrylex [0.7% available iodine] and Isopropyl Alcohol, 74% w/w)   Procedure #2:    Type: Trochanteric Bursa Injection #1  Primary Purpose: Diagnostic Region: Upper (proximal) Femoral Region Level: Hip Joint Target Area: Superior aspect of the hip joint cavity, going thru the superior portion of the capsular ligament. Approach: Posterolateral approach Laterality: Right     1. Chronic hip pain (1ry area of Pain) (Right)   2. Enthesopathy of hip region (Right)   3. Gluteal  tendinopathy (Right)   4. Osteoarthritis of hip (Right)   5. Trochanteric bursitis of hip (Right)    NAS-11 Pain score:   Pre-procedure: 5 /10   Post-procedure: 7 /10     Pre-op H&P Assessment:  [name] is a 61 y.o. (year old), male patient, seen today for interventional treatment. He  has a past surgical history that includes Shoulder surgery (23yrs ago and then 2013); Carpal tunnel release (2008); Cervical fusion (2003); Knee arthroscopy (15+yrs ago); Vasectomy (72yrs ago); Shoulder surgery (78yrs ago); colonosocpy; Circumcision (89yrs); and Anterior cervical decomp/discectomy fusion (11/19/2011). [name] has a current medication list which includes the following prescription(s): b complex vitamins, calcium citrate, cetirizine, cholecalciferol, clindamycin, ipratropium, ketotifen, montelukast, fish oil, pregabalin, and sildenafil. His primarily concern today is the Hip Pain (right)  Initial Vital Signs:  Pulse/HCG Rate: 64ECG Heart Rate: (!) 55 Temp: 98.1 F (36.7 C) Resp: 16 BP: 128/90 SpO2: 100 %  BMI: Estimated body mass index is 28.87 kg/m as calculated from the following:   Height as of this encounter: 5\' 11"  (1.803 m).   Weight as of this encounter: 207 lb (93.9 kg).  Risk Assessment: Allergies: Reviewed. He has No Known Allergies.  Allergy Precautions: None required Coagulopathies: Reviewed. None identified.  Blood-thinner therapy: None at this time Active Infection(s): Reviewed. None identified. [name] is afebrile  Site Confirmation: [name] was asked to confirm the procedure and laterality before marking the site Procedure checklist: Completed Consent: Before the procedure and under the influence of no sedative(s), amnesic(s), or anxiolytics, the patient was informed of the treatment options, risks and possible complications. To fulfill our ethical and legal obligations, as recommended by the American Medical Association's Code of Ethics, I have informed the  patient of my clinical impression; the nature and purpose of the treatment or procedure; the risks,  benefits, and possible complications of the intervention; the alternatives, including doing nothing; the risk(s) and benefit(s) of the alternative treatment(s) or procedure(s); and the risk(s) and benefit(s) of doing nothing. The patient was provided information about the general risks and possible complications associated with the procedure. These may include, but are not limited to: failure to achieve desired goals, infection, bleeding, organ or nerve damage, allergic reactions, paralysis, and death. In addition, the patient was informed of those risks and complications associated to the procedure, such as failure to decrease pain; infection; bleeding; organ or nerve damage with subsequent damage to sensory, motor, and/or autonomic systems, resulting in permanent pain, numbness, and/or weakness of one or several areas of the body; allergic reactions; (i.e.: anaphylactic reaction); and/or death. Furthermore, the patient was informed of those risks and complications associated with the medications. These include, but are not limited to: allergic reactions (i.e.: anaphylactic or anaphylactoid reaction(s)); adrenal axis suppression; blood sugar elevation that in diabetics may result in ketoacidosis or comma; water retention that in patients with history of congestive heart failure may result in shortness of breath, pulmonary edema, and decompensation with resultant heart failure; weight gain; swelling or edema; medication-induced neural toxicity; particulate matter embolism and blood vessel occlusion with resultant organ, and/or nervous system infarction; and/or aseptic necrosis of one or more joints. Finally, the patient was informed that Medicine is not an exact science; therefore, there is also the possibility of unforeseen or unpredictable risks and/or possible complications that may result in a catastrophic  outcome. The patient indicated having understood very clearly. We have given the patient no guarantees and we have made no promises. Enough time was given to the patient to ask questions, all of which were answered to the patient's satisfaction. Mr. Wild has indicated that he wanted to continue with the procedure. Attestation: I, the ordering provider, attest that I have discussed with the patient the benefits, risks, side-effects, alternatives, likelihood of achieving goals, and potential problems during recovery for the procedure that I have provided informed consent. Date   Time: 03/25/2021  9:47 AM  Pre-Procedure Preparation:  Monitoring: As per clinic protocol. Respiration, ETCO2, SpO2, BP, heart rate and rhythm monitor placed and checked for adequate function Safety Precautions: Patient was assessed for positional comfort and pressure points before starting the procedure. Time-out: I initiated and conducted the "Time-out" before starting the procedure, as per protocol. The patient was asked to participate by confirming the accuracy of the "Time Out" information. Verification of the correct person, site, and procedure were performed and confirmed by me, the nursing staff, and the patient. "Time-out" conducted as per Joint Commission's Universal Protocol (UP.01.01.01). Time: 1048  Description of Procedure #1:  Safety Precautions: Aspiration looking for blood return was conducted prior to all injections. At no point did we inject any substances, as a needle was being advanced. No attempts were made at seeking any paresthesias. Safe injection practices and needle disposal techniques used. Medications properly checked for expiration dates. SDV (single dose vial) medications used. Description of the Procedure: Protocol guidelines were followed. The patient was placed in position over the fluoroscopy table. The target area was identified and the area prepped in the usual manner. Skin & deeper tissues  infiltrated with local anesthetic. Appropriate amount of time allowed to pass for local anesthetics to take effect. The procedure needles were then advanced to the target area. Proper needle placement secured. Negative aspiration confirmed. Solution injected in intermittent fashion, asking for systemic symptoms every 0.5cc of injectate. The needles  were then removed and the area cleansed, making sure to leave some of the prepping solution back to take advantage of its long term bactericidal properties.  Start Time: 1048 hrs. Materials:  Needle(s) Type: Spinal Needle Gauge: 22G Length: 7-in Medication(s): Please see orders for medications and dosing details.  Imaging Guidance (Non-Spinal):          Type of Imaging Technique: Fluoroscopy Guidance (Non-Spinal) Indication(s): Assistance in needle guidance and placement for procedures requiring needle placement in or near specific anatomical locations not easily accessible without such assistance. Exposure Time: Please see nurses notes. Contrast: Before injecting any contrast, we confirmed that the patient did not have an allergy to iodine, shellfish, or radiological contrast. Once satisfactory needle placement was completed at the desired level, radiological contrast was injected. Contrast injected under live fluoroscopy. No contrast complications. See chart for type and volume of contrast used. Fluoroscopic Guidance: I was personally present during the use of fluoroscopy. "Tunnel Vision Technique" used to obtain the best possible view of the target area. Parallax error corrected before commencing the procedure. "Direction-depth-direction" technique used to introduce the needle under continuous pulsed fluoroscopy. Once target was reached, antero-posterior, oblique, and lateral fluoroscopic projection used confirm needle placement in all planes. Images permanently stored in EMR. Interpretation: I personally interpreted the imaging intraoperatively.  Adequate needle placement confirmed in multiple planes. Appropriate spread of contrast into desired area was observed. No evidence of afferent or efferent intravascular uptake. Permanent images saved into the patient's record.   Description of Procedure #2:  Description of the Procedure: Skin & deeper tissues infiltrated with local anesthetic. Appropriate amount of time allowed to pass for local anesthetics to take effect. The procedure needles were then advanced to the target area. Proper needle placement secured. Negative aspiration confirmed. Solution injected in intermittent fashion, asking for systemic symptoms every 0.5cc of injectate. The needles were then removed and the area cleansed, making sure to leave some of the prepping solution back to take advantage of its long term bactericidal properties.  Vitals:   03/25/21 1042 03/25/21 1047 03/25/21 1051 03/25/21 1056  BP: 125/84 122/88 127/87 (!) 125/91  Pulse:      Resp: 16 17 16 19   Temp:      SpO2: 100% 99% 99% 99%  Weight:      Height:         End Time: 1055 hrs.         Materials:  Needle(s) Type: Spinal Needle Gauge: 22G Length: 5.0-in Medication(s): Please see orders for medications and dosing details.  Imaging Guidance (Non-Spinal):          Type of Imaging Technique: Fluoroscopy Guidance (Non-Spinal) Indication(s): Assistance in needle guidance and placement for procedures requiring needle placement in or near specific anatomical locations not easily accessible without such assistance. Exposure Time: Please see nurses notes. Contrast: Before injecting any contrast, we confirmed that the patient did not have an allergy to iodine, shellfish, or radiological contrast. Once satisfactory needle placement was completed at the desired level, radiological contrast was injected. Contrast injected under live fluoroscopy. No contrast complications. See chart for type and volume of contrast used. Fluoroscopic Guidance: I was  personally present during the use of fluoroscopy. "Tunnel Vision Technique" used to obtain the best possible view of the target area. Parallax error corrected before commencing the procedure. "Direction-depth-direction" technique used to introduce the needle under continuous pulsed fluoroscopy. Once target was reached, antero-posterior, oblique, and lateral fluoroscopic projection used confirm needle placement in all planes. Images  permanently stored in EMR. Interpretation: I personally interpreted the imaging intraoperatively. Adequate needle placement confirmed in multiple planes. Appropriate spread of contrast into desired area was observed. No evidence of afferent or efferent intravascular uptake. Permanent images saved into the patient's record.  Antibiotic Prophylaxis:   Anti-infectives (From admission, onward)    None      Indication(s): None identified  Post-operative Assessment:  Post-procedure Vital Signs:  Pulse/HCG Rate: 6460 Temp: 98.1 F (36.7 C) Resp: 19 BP: (!) 125/91 SpO2: 99 %  EBL: None  Complications: No immediate post-treatment complications observed by team, or reported by patient.  Note: The patient tolerated the entire procedure well. A repeat set of vitals were taken after the procedure and the patient was kept under observation following institutional policy, for this type of procedure. Post-procedural neurological assessment was performed, showing return to baseline, prior to discharge. The patient was provided with post-procedure discharge instructions, including a section on how to identify potential problems. Should any problems arise concerning this procedure, the patient was given instructions to immediately contact us, at any time, without hesitation. In any case, we plan to contact the patient by telephone for a follow-up status report regarding this interventional procedure.  Comments:  No additional relevant information.  Plan of Care  Orders:   Orders Placed This Encounter  Procedures   HIP INJECTION    Scheduling Instructions:     Side: Right-sided     Sedation: Patient's choice.     Timeframe: Today   HIP INJECTION    Purpose: Therapeutic/Diagnostic Indication: Hip pain 2ry to Trochanteric Burlitis right (M70.61).    Scheduling Instructions:     Procedure: Trochanteric bursa injection     Laterality: Right-sided     Sedation: Patient's choice.     Timeframe: Today   DG PAIN CL[facility] C-ARM 1-60 MIN NO REPORT    Intraoperative interpretation by procedural physician at [facility].    Standing Status:   Standing    Number of Occurrences:   1    Order Specific Question:   Reason for exam:    Answer:   Assistance in needle guidance and placement for procedures requiring needle placement in or near specific anatomical locations not easily accessible without such assistance.   Informed Consent Details: Physician/Practitioner Attestation; Transcribe to consent form and obtain patient signature    Nursing Order: Transcribe to consent form and obtain patient signature. Note: Always confirm laterality of pain with Mr. Massie MaroonMcCoy, before procedure.    Order Specific Question:   Physician/Practitioner attestation of informed consent for procedure/surgical case    Answer:   I, the physician/practitioner, attest that I have discussed with the patient the benefits, risks, side effects, alternatives, likelihood of achieving goals and potential problems during recovery for the procedure that I have provided informed consent.    Order Specific Question:   Procedure    Answer:   Hip injection    Order Specific Question:   Physician/Practitioner performing the procedure    Answer:   Francina Beery A. Laban EmperorNaveira, MD    Order Specific Question:   Indication/Reason    Answer:   Hip Joint Pain (Arthralgia)   Informed Consent Details: Physician/Practitioner Attestation; Transcribe to consent form and obtain patient signature    Note: Always confirm  laterality of pain with Mr. Massie MaroonMcCoy, before procedure. Transcribe to consent form and obtain patient signature.    Order Specific Question:   Physician/Practitioner attestation of informed consent for procedure/surgical case    Answer:   I,  the physician/practitioner, attest that I have discussed with the patient the benefits, risks, side effects, alternatives, likelihood of achieving goals and potential problems during recovery for the procedure that I have provided informed consent.    Order Specific Question:   Procedure    Answer:   Hip bursa injection    Order Specific Question:   Physician/Practitioner performing the procedure    Answer:   Kymir Coles A. Laban Emperor, MD    Order Specific Question:   Indication/Reason    Answer:   Hip bursitis   Provide equipment / supplies at bedside    "Block Tray" (Disposable   single use) Needle type: SpinalSpinal Amount/quantity: 1 Size: Long (7-inch) Gauge: 22G    Standing Status:   Standing    Number of Occurrences:   1    Order Specific Question:   Specify    Answer:   Block Tray   Chronic Opioid Analgesic:  None MME/day: 0 mg/day   Medications ordered for procedure: Meds ordered this encounter  Medications   iohexol (OMNIPAQUE) 180 MG/ML injection 10 mL    Must be Myelogram-compatible. If not available, you may substitute with a water-soluble, non-ionic, hypoallergenic, myelogram-compatible radiological contrast medium.   lidocaine (XYLOCAINE) 2 % (with pres) injection 400 mg   pentafluoroprop-tetrafluoroeth (GEBAUERS) aerosol   ropivacaine (PF) 2 mg/mL (0.2%) (NAROPIN) injection 9 mL   methylPREDNISolone acetate (DEPO-MEDROL) injection 80 mg   Medications administered: We administered iohexol, lidocaine, pentafluoroprop-tetrafluoroeth, ropivacaine (PF) 2 mg/mL (0.2%), and methylPREDNISolone acetate.  See the medical record for exact dosing, route, and time of administration.  Follow-up plan:   Return in about 2 weeks (around 04/08/2021)  for Proc-day (T,Th), (F2F), (PPE).       Interventional Therapies  Risk   Complexity Considerations:   Estimated body mass index is 29.85 kg/m as calculated from the following:   Height as of this encounter: 5' 10.5" (1.791 m).   Weight as of this encounter: 211 lb (95.7 kg). WNL   Planned   Pending:   Pending further evaluation   Under consideration:   Diagnostic right hip injection targeting the gluteal tendon and trochanteric bursa #1  Diagnostic lumbar facet medial branch block #1  Diagnostic bilateral shoulder joint injections #1  Diagnostic bilateral suprascapular nerve block #1  Diagnostic cervical epidural steroid injection #1  Diagnostic cervical facet medial branch block #1  Diagnostic right knee injection #1  Diagnostic right genicular nerve block #1    Completed:   None at this time   Therapeutic   Palliative (PRN) options:   None established     Recent Visits Date Type Provider Dept  03/17/21 Office Visit Delano Metz, MD [facility]  Showing recent visits within past 90 days and meeting all other requirements Today's Visits Date Type Provider Dept  03/25/21 Procedure visit Delano Metz, MD [facility]  Showing today's visits and meeting all other requirements Future Appointments Date Type Provider Dept  04/10/21 Appointment Delano Metz, MD [facility]  Showing future appointments within next 90 days and meeting all other requirements  Disposition: Discharge home  Discharge (Date   Time): 03/25/2021; 1059 hrs.   Primary Care Physician: Rosanne Sack (Inactive) Location: [facility] Note by: Oswaldo Done, MD Date: 03/25/2021; Time: 11:51 AM  Disclaimer:  Medicine is not an exact science. The only guarantee in medicine is that nothing is guaranteed. It is important to note that the decision to proceed with this intervention was based on the  information collected from the  patient. The Data and conclusions were drawn from the patient's questionnaire, the interview, and the physical examination. Because the information was provided in large part by the patient, it cannot be guaranteed that it has not been purposely or unconsciously manipulated. Every effort has been made to obtain as much relevant data as possible for this evaluation. It is important to note that the conclusions that lead to this procedure are derived in large part from the available data. Always take into account that the treatment will also be dependent on availability of resources and existing treatment guidelines, considered by other Pain Management Practitioners as being common knowledge and practice, at the time of the intervention. For Medico-Legal purposes, it is also important to point out that variation in procedural techniques and pharmacological choices are the acceptable norm. The indications, contraindications, technique, and results of the above procedure should only be interpreted and judged by a Board-Certified Interventional Pain Specialist with extensive familiarity and expertise in the same exact procedure and technique.

## 2021-03-25 NOTE — Progress Notes (Signed)
Safety precautions to be maintained throughout the outpatient stay will include: orient to surroundings, keep bed in low position, maintain call bell within reach at all times, provide assistance with transfer out of bed and ambulation.  

## 2021-03-26 ENCOUNTER — Telehealth: Payer: Self-pay

## 2021-03-26 NOTE — Telephone Encounter (Signed)
Post procedure phone call. Patient states he is doing well.  

## 2021-04-10 ENCOUNTER — Ambulatory Visit: Payer: No Typology Code available for payment source | Attending: Pain Medicine | Admitting: Pain Medicine

## 2021-04-10 ENCOUNTER — Encounter: Payer: Self-pay | Admitting: Pain Medicine

## 2021-04-10 ENCOUNTER — Other Ambulatory Visit: Payer: Self-pay

## 2021-04-10 VITALS — BP 137/90 | HR 56 | Temp 98.3°F | Resp 16 | Ht 70.5 in | Wt 207.0 lb

## 2021-04-10 DIAGNOSIS — M25551 Pain in right hip: Secondary | ICD-10-CM | POA: Insufficient documentation

## 2021-04-10 DIAGNOSIS — M545 Low back pain, unspecified: Secondary | ICD-10-CM | POA: Diagnosis present

## 2021-04-10 DIAGNOSIS — M1611 Unilateral primary osteoarthritis, right hip: Secondary | ICD-10-CM | POA: Diagnosis not present

## 2021-04-10 DIAGNOSIS — M76891 Other specified enthesopathies of right lower limb, excluding foot: Secondary | ICD-10-CM | POA: Insufficient documentation

## 2021-04-10 DIAGNOSIS — R1031 Right lower quadrant pain: Secondary | ICD-10-CM | POA: Diagnosis present

## 2021-04-10 DIAGNOSIS — M67951 Unspecified disorder of synovium and tendon, right thigh: Secondary | ICD-10-CM | POA: Diagnosis not present

## 2021-04-10 DIAGNOSIS — G8929 Other chronic pain: Secondary | ICD-10-CM | POA: Diagnosis present

## 2021-04-10 DIAGNOSIS — M7061 Trochanteric bursitis, right hip: Secondary | ICD-10-CM | POA: Diagnosis present

## 2021-04-10 NOTE — Progress Notes (Signed)
PROVIDER NOTE: Information contained herein reflects review and annotations entered in association with encounter. Interpretation of such information and data should be left to medically-trained personnel. Information provided to patient can be located elsewhere in the medical record under "Patient Instructions". Document created using STT-dictation technology, any transcriptional errors that may result from process are unintentional.    Patient: [name]  Service Category: E/M  Provider: Gaspar Cola, MD  DOB: [birth year]  DOS: 04/10/2021  Specialty: Interventional Pain Management  MRN: [id number]  Setting: Ambulatory outpatient  PCP: Jenne Pane (Inactive)  Type: Established Patient    Referring Provider: No ref. provider found  Location: Office  Delivery: Face-to-face     HPI  [name], a 61 y.o. year old male, is here today because of his Chronic hip pain, right [M25.551, G89.29]. Mr. Labarge primary complain today is Hip Pain (right) Last encounter: My last encounter with him was on 03/25/2021. Pertinent problems: Mr. Knutzen has Gout; Chronic shoulder pain (Right); Chronic low back pain (2ry area of Pain) (Midline) w/o sciatica; Postlaminectomy syndrome, cervical region; Chronic pain syndrome; Chronic knee pain (5th area of Pain) (Right); Chronic hip pain (1ry area of Pain) (Right); Enthesopathy of hip region (Right); Chronic groin pain (Right); Chronic low back pain (Right) w/o sciatica; Lumbar facet syndrome (Bilateral) (R>L); Chronic shoulder pain (3ry area of Pain) (Bilateral) (R>L); Chronic neck pain with history of cervical spinal surgery (4th area of Pain); Trochanteric bursitis of hip (Right); Primary osteoarthritis of right knee; Carpal tunnel syndrome; Cervical radiculopathy; Degenerative joint disease of shoulder (Right); Impingement syndrome of shoulder region; Lesion of ulnar nerve, right upper limb; Sprain of sacroiliac joint; Osteoarthritis of hip (Right); Pain in right  hip; Pain in left shoulder; Pain in right elbow; Gluteal tendinopathy (Right); DDD (degenerative disc disease), lumbosacral; Lumbar facet hypertrophy (Multilevel) (Bilateral); and 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 Pain Assessment: Severity of Chronic pain is reported as a 2 /10. Location: Hip Right/pain  radiates down his right hip. Onset: More than a month ago. Quality: Discomfort, Aching, Stabbing. Timing: Intermittent. Modifying factor(s): repositioning. Vitals:  height is 5' 10.5" (1.791 m) and weight is 207 lb (93.9 kg). His temperature is 98.3 F (36.8 C). His blood pressure is 137/90 and his pulse is 56 (abnormal). His respiration is 16 and oxygen saturation is 100%.   Reason for encounter: post-procedure evaluation and assessment.  According to the patient he attained 100% relief of his pain, for the duration of the local anesthetic.  Unfortunately, once the local anesthetic wore off, then the pain started coming back and he currently refers having an ongoing 30% relief of his pain.  However, further questioning has revealed that the pain that he was experiencing on the lateral aspect of his leg (trochanteric bursa) went away and has not returned meaning that he has attained 100% relief of the pain from the right trochanteric bursitis.  Based on his MRI, his second area of pain was that of the gluteal tendinopathy in the posterior aspect of the hip joint.  He refers that this pain also completely went away for the duration of the local anesthetic, but once it wore off, it has come back.  However, he specifies that while before the procedure the pain in both areas was constant, currently the pain that he is experiencing in the buttocks area is intermittent.  He has also noticed that certain movements of his back also cause him to have  pain in the right lower back and buttocks area.  I have explained to the patient that this is a clinical  challenge since we know that he has a lumbar facet syndrome that can actually referred pain towards the buttocks area, just like the gluteal muscle tendinopathy.  Today we talked about the need to get a better picture of what is happening in the lumbar region and I suggested an MRI.  He indicated that he had 1 done less than 2 years ago.  However, today I have looked all over the electronic medical record and there is no evidence of this MRI.  He is sure that he had it done at the [facility] and therefore today we had him sign some release form so that we can call them and have them fax the results back to [place] so that we can review them and perhaps get a better understanding of what is going on.  The primary reason why we need to do this is because he also has been having pain in the right flank area and groin area suggestive of a right-sided L1/L2 lumbar radiculopathy.  I am interested in reviewing the MRI to see if there is any evidence of a right-sided T12 and L1, L1-2, or L2-3 pathology that may explain his right groin pain, right flank pain, and the other symptoms that he has been experiencing.  The patient today was given the option of returning for a diagnostic right-sided L2-3 LESI, or waiting for the results of the MRI.  He has indicated that currently he is doing well and does not feel that he needs anything at this time.  For this reason, we will wait until we get the results of the MRI to further evaluate what is taking place within the lumbar region that may explain some of the other symptoms in the flank, groin, and upper extremity.  I have instructed the patient to give [place] a call if the pain gets any worse.  He understood and accepted.  Post-procedure evaluation     Procedure #1:  Anesthesia, Analgesia, Anxiolysis:  Type: Intra-Articular Hip Injection #1  Primary Purpose: Diagnostic Region: Posterolateral hip joint area. Level: Lower pelvic and hip joint level. Target Area: Superior  aspect of the hip joint cavity, going thru the superior portion of the capsular ligament. Approach: Posterolateral approach. Laterality: Right  Anesthesia: Local (1-2% Lidocaine)  Anxiolysis: None  Sedation: None  Guidance: Fluoroscopy           Position: Lateral Decubitus with bad side up Area Prepped: Entire Posterolateral hip area. DuraPrep (Iodine Povacrylex [0.7% available iodine] and Isopropyl Alcohol, 74% w/w)   Procedure #2:    Type: Trochanteric Bursa Injection #1  Primary Purpose: Diagnostic Region: Upper (proximal) Femoral Region Level: Hip Joint Target Area: Superior aspect of the hip joint cavity, going thru the superior portion of the capsular ligament. Approach: Posterolateral approach Laterality: Right     1. Chronic hip pain (1ry area of Pain) (Right)   2. Enthesopathy of hip region (Right)   3. Gluteal tendinopathy (Right)   4. Osteoarthritis of hip (Right)   5. Trochanteric bursitis of hip (Right)    NAS-11 Pain score:   Pre-procedure: 5 /10   Post-procedure: 7 /10      Effectiveness:  Initial hour after procedure: 100 %. Subsequent 4-6 hours post-procedure: 100 %. Analgesia past initial 6 hours: 30 % (ongoing). Ongoing improvement:  Analgesic: Further questioning has revealed that the pain that he was experiencing  over the trochanteric bursa went away with the local anesthetic and has not returned meaning that he has attained 100% relief of the trochanteric bursa pain.  However, he indicates that the pain in the buttocks area seem to have returned, but while in the past he used to be constant, now it seems to be more intermittent. Function: Somewhat improved ROM: Somewhat improved  Pharmacotherapy Assessment  Analgesic: None MME/day: 0 mg/day   Monitoring: [place] PMP: PDMP reviewed during this encounter.       Pharmacotherapy: No side-effects or adverse reactions reported. Compliance: No problems identified. Effectiveness: Clinically acceptable.  No  notes on file  UDS:  Summary  Date Value Ref Range Status  03/17/2021 Note  Final    Comment:    ==================================================================== Compliance Drug Analysis, Ur ==================================================================== Test                             Result       Flag       Units  Drug Present and Declared for Prescription Verification   Pregabalin                     PRESENT      EXPECTED  Drug Present not Declared for Prescription Verification   Acetaminophen                  PRESENT      UNEXPECTED ==================================================================== Test                      Result    Flag   Units      Ref Range   Creatinine              127              mg/dL      >=20 ==================================================================== Declared Medications:  The flagging and interpretation on this report are based on the  following declared medications.  Unexpected results may arise from  inaccuracies in the declared medications.   **Note: The testing scope of this panel includes these medications:   Pregabalin (Lyrica)   **Note: The testing scope of this panel does not include the  following reported medications:   Calcium  Cetirizine (Zyrtec)  Clindamycin (Cleocin)  Fish Oil  Ipratropium (Atrovent)  Ketotifen (Zaditor)  Montelukast (Singulair)  Sildenafil (Viagra)  Vitamin B  Vitamin D3 ==================================================================== For clinical consultation, please call [phone]. ====================================================================      ROS  Constitutional: Denies any fever or chills Gastrointestinal: No reported hemesis, hematochezia, vomiting, or acute GI distress Musculoskeletal: Denies any acute onset joint swelling, redness, loss of ROM, or weakness Neurological: No reported episodes of acute onset apraxia, aphasia, dysarthria, agnosia, amnesia,  paralysis, loss of coordination, or loss of consciousness  Medication Review  Fish Oil, b complex vitamins, calcium citrate, cetirizine, cholecalciferol, clindamycin, ipratropium, ketotifen, montelukast, pregabalin, and sildenafil  History Review  Allergy: Mr. Mccalla has No Known Allergies. Drug: Mr. Overfelt  reports no history of drug use. Alcohol:  reports no history of alcohol use. Tobacco:  reports that he has never smoked. He does not have any smokeless tobacco history on file. Social: Mr. Wulf  reports that he has never smoked. He does not have any smokeless tobacco history on file. He reports that he does not drink alcohol and does not use drugs. Medical:  has a past medical history of Allergy, Arthritis, Chronic  neck pain, Gout, History of kidney stones, Hyperlipidemia, Joint pain, and Sleep apnea. Surgical: Mr. Blatz  has a past surgical history that includes Shoulder surgery (25yr ago and then 2013); Carpal tunnel release (2008); Cervical fusion (2003); Knee arthroscopy (15+yrs ago); Vasectomy (89yr[age]); Shoulder surgery ([age]yrgo); colonosocpy; Circumcision ([age]yrand Anterior cervical decomp/discectomy fusion (11/19/2011). Family: family history includes Cancer in his mother and sister.  Laboratory Chemistry Profile   Renal Lab Results  Component Value Date   BUN 8 03/17/2021   CREATININE 0.99 03/17/2021   BCR 8 (L) 03/17/2021   GFRAA 84 08/24/2013   GFRNONAA 72 08/24/2013    Hepatic Lab Results  Component Value Date   AST 32 03/17/2021   ALT 16 08/24/2013   ALBUMIN 4.3 03/17/2021   ALKPHOS 70 03/17/2021    Electrolytes Lab Results  Component Value Date   NA 142 03/17/2021   K 4.5 03/17/2021   CL 103 03/17/2021   CALCIUM 9.9 03/17/2021   MG 2.2 03/17/2021    Bone Lab Results  Component Value Date   VD25OH 29 (L) 07/14/2012   25OHVITD1 39 03/17/2021   25OHVITD2 <1.0 03/17/2021   25OHVITD3 39 03/17/2021   TESTOFREE 87.2 07/14/2012   TESTOSTERONE 352  07/14/2012    Inflammation (CRP: Acute Phase) (ESR: Chronic Phase) Lab Results  Component Value Date   CRP <1 03/17/2021   ESRSEDRATE 9 03/17/2021         Note: Above Lab results reviewed.  Recent Imaging Review  DG PAIN CLINIC C-ARM 1-60 MIN NO REPORT Fluoro was used, but no Radiologist interpretation will be provided.  Please refer to "NOTES" tab for provider progress note. Note: Reviewed        Physical Exam  General appearance: Well nourished, well developed, and well hydrated. In no apparent acute distress Mental status: Alert, oriented x 3 (person, place, & time)       Respiratory: No evidence of acute respiratory distress Eyes: PERLA Vitals: BP 137/90    Pulse (!) 56    Temp 98.3 F (36.8 C)    Resp 16    Ht 5' 10.5" (1.791 m)    Wt 207 lb (93.9 kg)    SpO2 100%    BMI 29.28 kg/m  BMI: Estimated body mass index is 29.28 kg/m as calculated from the following:   Height as of this encounter: 5' 10.5" (1.791 m).   Weight as of this encounter: 207 lb (93.9 kg). Ideal: Ideal body weight: 74.1 kg (163 lb 7.5 oz) Adjusted ideal body weight: 82 kg (180 lb 14.1 oz)  Assessment   Status Diagnosis  Controlled Controlled Controlled 1. Chronic hip pain (1ry area of Pain) (Right)   2. Enthesopathy of hip region (Right)   3. Gluteal tendinopathy (Right)   4. Osteoarthritis of hip (Right)   5. Trochanteric bursitis of hip (Right)   6. Chronic groin pain (Right)   7. Chronic low back pain (2ry area of Pain) (Midline) w/o sciatica      Updated Problems: No problems updated.  Plan of Care  Problem-specific:  No problem-specific Assessment & Plan notes found for this encounter.  Mr. ToddPARKER WHERLEY a current medication list which includes the following long-term medication(s): calcium citrate, ipratropium, montelukast, pregabalin, and sildenafil.  Pharmacotherapy (Medications Ordered): No orders of the defined types were placed in this encounter.  Orders:  No orders of  the defined types were placed in this encounter.  Follow-up plan:   Return if symptoms worsen  or fail to improve.     Interventional Therapies  Risk   Complexity Considerations:   Estimated body mass index is 29.85 kg/m as calculated from the following:   Height as of this encounter: 5' 10.5" (1.791 m).   Weight as of this encounter: 211 lb (95.7 kg). WNL   Planned   Pending:   Pending further evaluation   Under consideration:   Diagnostic right hip injection targeting the gluteal tendon and trochanteric bursa #1  Diagnostic lumbar facet medial branch block #1  Diagnostic bilateral shoulder joint injections #1  Diagnostic bilateral suprascapular nerve block #1  Diagnostic cervical epidural steroid injection #1  Diagnostic cervical facet medial branch block #1  Diagnostic right knee injection #1  Diagnostic right genicular nerve block #1    Completed:   None at this time   Therapeutic   Palliative (PRN) options:   None established      Recent Visits Date Type Provider Dept  03/25/21 Procedure visit Milinda Pointer, MD [facility]  03/17/21 Office Visit Milinda Pointer, MD [facility]  Showing recent visits within past 90 days and meeting all other requirements Today's Visits Date Type Provider Dept  04/10/21 Office Visit Milinda Pointer, MD [facility]  Showing today's visits and meeting all other requirements Future Appointments No visits were found meeting these conditions. Showing future appointments within next 90 days and meeting all other requirements  I discussed the assessment and treatment plan with the patient. The patient was provided an opportunity to ask questions and all were answered. The patient agreed with the plan and demonstrated an understanding of the instructions.  Patient advised to call back or seek an in-person evaluation if the symptoms or condition worsens.  Duration of encounter: 41 minutes.  Note  by: Gaspar Cola, MD Date: 04/10/2021; Time: 3:26 PM

## 2021-04-10 NOTE — Patient Instructions (Signed)
Call if you need [place]. [phone]

## 2021-08-05 ENCOUNTER — Ambulatory Visit (INDEPENDENT_AMBULATORY_CARE_PROVIDER_SITE_OTHER): Payer: No Typology Code available for payment source | Admitting: Allergy & Immunology

## 2021-08-05 ENCOUNTER — Encounter: Payer: Self-pay | Admitting: Allergy & Immunology

## 2021-08-05 VITALS — BP 128/86 | HR 78 | Temp 98.2°F | Resp 16 | Ht 70.5 in | Wt 215.2 lb

## 2021-08-05 DIAGNOSIS — J3089 Other allergic rhinitis: Secondary | ICD-10-CM | POA: Diagnosis not present

## 2021-08-05 DIAGNOSIS — L719 Rosacea, unspecified: Secondary | ICD-10-CM | POA: Diagnosis not present

## 2021-08-05 DIAGNOSIS — J302 Other seasonal allergic rhinitis: Secondary | ICD-10-CM

## 2021-08-05 NOTE — Patient Instructions (Addendum)
1. Seasonal and perennial allergic rhinitis - Testing today showed: weeds, trees, indoor molds, dust mites, and cockroach. - Copy of test results provided.  - Avoidance measures provided. - Stop taking: cetirizine - Continue with: Singulair (montelukast)  daily and Atrovent (ipratropium) 0.03% one spray per nostril 2-3 times daily as needed (CAN BE OVER DRYING) - Start taking: carbinoxamine 4 mg every 8 hours as needed - You can use an extra dose of the antihistamine, if needed, for breakthrough symptoms.  - Consider nasal saline rinses 1-2 times daily to remove allergens from the nasal cavities as well as help with mucous clearance (this is especially helpful to do before the nasal sprays are given) - Consider allergy shots as a means of long-term control. - Allergy shots "re-train" and "reset" the immune system to ignore environmental allergens and decrease the resulting immune response to those allergens (sneezing, itchy watery eyes, runny nose, nasal congestion, etc).    - Allergy shots improve symptoms in 75-85% of patients.  - We can discuss more at the next appointment if the medications are not working for you.  2. Return in about 3 months (around 11/05/2021).    Please inform us of any Emergency Department visits, hospitalizations, or changes in symptoms. Call us before going to the ED for breathing or allergy symptoms since we might be able to fit you in for a sick visit. Feel free to contact us anytime with any questions, problems, or concerns.  It was a pleasure to meet you today!  Websites that have reliable patient information: 1. American Academy of Asthma, Allergy, and Immunology: www.aaaai.org 2. Food Allergy Research and Education (FARE): foodallergy.org 3. Mothers of Asthmatics: http://www.asthmacommunitynetwork.org 4. American College of Allergy, Asthma, and Immunology: www.acaai.org   COVID-19 Vaccine Information can be found at:  [email or web address] For questions related to vaccine distribution or appointments, please email [email or web address] or call [phone].   We realize that you might be concerned about having an allergic reaction to the COVID19 vaccines. To help with that concern, WE ARE OFFERING THE COVID19 VACCINES IN OUR OFFICE! Ask the front desk for dates!     "Like" [place] on Facebook and Instagram for our latest updates!      A healthy democracy works best when [employer] participate! Make sure you are registered to vote! If you have moved or changed any of your contact information, you will need to get this updated before voting!  In some cases, you MAY be able to register to vote online: [email or web address]        Airborne Adult Perc - 08/05/21 1435     Time Antigen Placed 1440    Allergen Manufacturer Waynette Buttery    Location Back    Number of Test 59    1. Control-Buffer 50% Glycerol Negative    2. Control-Histamine 1 mg/ml 2+    3. Albumin saline Negative    4. Bahia Negative    5. [place] Negative    6. Johnson Negative    7. Kentucky Blue Negative    8. Meadow Fescue Negative    9. Perennial Rye Negative    10. Sweet Vernal Negative    11. Timothy Negative    12. Cocklebur Negative    13. Burweed Marshelder Negative    14. Ragweed, short Negative    15. Ragweed, Giant Negative    16. Plantain,  English Negative    17. Lamb's Quarters Negative    18. Sheep Sorrell Negative    19. Rough Pigweed Negative  20. Marsh Elder, Rough 2+    21. Mugwort, Common Negative    22. Ash mix Negative    23. Birch mix Negative    24. Beech American Negative    25. Box, Elder Negative    26. Cedar, red 3+    27. Cottonwood, [place] Negative    28. Elm mix Negative    29. Hickory Negative    30. Maple mix Negative    31. Oak, [place] mix Negative    32. Pecan Pollen Negative    33. Pine mix Negative     34. Sycamore Eastern Negative    35. Walnut, Black Pollen Negative    36. Alternaria alternata Negative    37. Cladosporium Herbarum Negative    38. Aspergillus mix Negative    39. Penicillium mix Negative    40. Bipolaris sorokiniana (Helminthosporium) Negative    41. Drechslera spicifera (Curvularia) Negative    42. Mucor plumbeus Negative    43. Fusarium moniliforme Negative    44. Aureobasidium pullulans (pullulara) Negative    45. Rhizopus oryzae Negative    46. Botrytis cinera Negative    47. Epicoccum nigrum Negative    48. Phoma betae Negative    49. Candida Albicans Negative    50. Trichophyton mentagrophytes Negative    51. Mite, D Farinae  5,000 AU/ml Negative    52. Mite, D Pteronyssinus  5,000 AU/ml Negative    53. Cat Hair 10,000 BAU/ml Negative    54.  Dog Epithelia Negative    55. Mixed Feathers Negative    56. Horse Epithelia Negative    57. Cockroach, German Negative    58. Mouse Negative    59. Tobacco Leaf Negative             Intradermal - 08/05/21 1508     Time Antigen Placed --   error   Allergen Manufacturer --    Location --    Number of Test --    Control Negative    [place] Negative    Johnson Negative    7 Grass Negative    Ragweed mix Negative    Tree mix Negative    Mold 1 Negative    Mold 2 Negative    Mold 3 Negative    Mold 4 1+    Cat Negative    Dog Negative    Cockroach 1+    Mite mix 1+             Reducing Pollen Exposure  The American Academy of Allergy, Asthma and Immunology suggests the following steps to reduce your exposure to pollen during allergy seasons.    Do not hang sheets or clothing out to dry; pollen may collect on these items. Do not mow lawns or spend time around freshly cut grass; mowing stirs up pollen. Keep windows closed at night.  Keep car windows closed while driving. Minimize morning activities outdoors, a time when pollen counts are usually at their highest. Stay indoors as much as  possible when pollen counts or humidity is high and on windy days when pollen tends to remain in the air longer. Use air conditioning when possible.  Many air conditioners have filters that trap the pollen spores. Use a HEPA room air filter to remove pollen form the indoor air you breathe.   Control of Mold Allergen   Mold and fungi can grow on a variety of surfaces provided certain temperature and moisture conditions exist.  Outdoor molds grow on plants,  decaying vegetation and soil.  The major outdoor mold, Alternaria and Cladosporium, are found in very high numbers during hot and dry conditions.  Generally, a late Summer - Fall peak is seen for common outdoor fungal spores.  Rain will temporarily lower outdoor mold spore count, but counts rise rapidly when the rainy period ends.  The most important indoor molds are Aspergillus and Penicillium.  Dark, humid and poorly ventilated basements are ideal sites for mold growth.  The next most common sites of mold growth are the bathroom and the kitchen.   Indoor (Perennial) Mold Control   Positive indoor molds via skin testing: Fusarium, Aureobasidium (Pullulara), and Rhizopus  Maintain humidity below 50%. Clean washable surfaces with 5% bleach solution. Remove sources e.g. contaminated carpets.    Control of Cockroach Allergen  Cockroach allergen has been identified as an important cause of acute attacks of asthma, especially in urban settings.  There are fifty-five species of cockroach that exist in the [place], however only three, the [place], [place] species produce allergen that can affect patients with Asthma.  Allergens can be obtained from fecal particles, egg casings and secretions from cockroaches.    Remove food sources. Reduce access to water. Seal access and entry points. Spray runways with 0.5-1% Diazinon or Chlorpyrifos Blow boric acid power under stoves and refrigerator. Place bait stations (hydramethylnon)  at feeding sites.  Control of Dust Mite Allergen    Dust mites play a major role in allergic asthma and rhinitis.  They occur in environments with high humidity wherever human skin is found.  Dust mites absorb humidity from the atmosphere (ie, they do not drink) and feed on organic matter (including shed human and animal skin).  Dust mites are a microscopic type of insect that you cannot see with the naked eye.  High levels of dust mites have been detected from mattresses, pillows, carpets, upholstered furniture, bed covers, clothes, soft toys and any woven material.  The principal allergen of the dust mite is found in its feces.  A gram of dust may contain 1,000 mites and 250,000 fecal particles.  Mite antigen is easily measured in the air during house cleaning activities.  Dust mites do not bite and do not cause harm to humans, other than by triggering allergies/asthma.    Ways to decrease your exposure to dust mites in your home:  Encase mattresses, box springs and pillows with a mite-impermeable barrier or cover   Wash sheets, blankets and drapes weekly in hot water (130 F) with detergent and dry them in a dryer on the hot setting.  Have the room cleaned frequently with a vacuum cleaner and a damp dust-mop.  For carpeting or rugs, vacuuming with a vacuum cleaner equipped with a high-efficiency particulate air (HEPA) filter.  The dust mite allergic individual should not be in a room which is being cleaned and should wait 1 hour after cleaning before going into the room. Do not sleep on upholstered furniture (eg, couches).   If possible removing carpeting, upholstered furniture and drapery from the home is ideal.  Horizontal blinds should be eliminated in the rooms where the person spends the most time (bedroom, study, television room).  Washable vinyl, roller-type shades are optimal. Remove all non-washable stuffed toys from the bedroom.  Wash stuffed toys weekly like sheets and blankets above.    Reduce indoor humidity to less than 50%.  Inexpensive humidity monitors can be purchased at most hardware stores.  Do not use a humidifier  as can make the problem worse and are not recommended.   Allergy Shots   Allergies are the result of a chain reaction that starts in the immune system. Your immune system controls how your body defends itself. For instance, if you have an allergy to pollen, your immune system identifies pollen as an invader or allergen. Your immune system overreacts by producing antibodies called Immunoglobulin E (IgE). These antibodies travel to cells that release chemicals, causing an allergic reaction.  The concept behind allergy immunotherapy, whether it is received in the form of shots or tablets, is that the immune system can be desensitized to specific allergens that trigger allergy symptoms. Although it requires time and patience, the payback can be long-term relief.  How Do Allergy Shots Work?  Allergy shots work much like a vaccine. Your body responds to injected amounts of a particular allergen given in increasing doses, eventually developing a resistance and tolerance to it. Allergy shots can lead to decreased, minimal or no allergy symptoms.  There generally are two phases: build-up and maintenance. Build-up often ranges from three to six months and involves receiving injections with increasing amounts of the allergens. The shots are typically given once or twice a week, though more rapid build-up schedules are sometimes used.  The maintenance phase begins when the most effective dose is reached. This dose is different for each person, depending on how allergic you are and your response to the build-up injections. Once the maintenance dose is reached, there are longer periods between injections, typically two to four weeks.  Occasionally doctors give cortisone-type shots that can temporarily reduce allergy symptoms. These types of shots are different and should not  be confused with allergy immunotherapy shots.  Who Can Be Treated with Allergy Shots?  Allergy shots may be a good treatment approach for people with allergic rhinitis (hay fever), allergic asthma, conjunctivitis (eye allergy) or stinging insect allergy.   Before deciding to begin allergy shots, you should consider:   The length of allergy season and the severity of your symptoms  Whether medications and/or changes to your environment can control your symptoms  Your desire to avoid long-term medication use  Time: allergy immunotherapy requires a major time commitment  Cost: may vary depending on your insurance coverage  Allergy shots for children age 23five and older are effective and often well tolerated. They might prevent the onset of new allergen sensitivities or the progression to asthma.  Allergy shots are not started on patients who are pregnant but can be continued on patients who become pregnant while receiving them. In some patients with other medical conditions or who take certain common medications, allergy shots may be of risk. It is important to mention other medications you talk to your allergist.   When Will I Feel Better?  Some may experience decreased allergy symptoms during the build-up phase. For others, it may take as long as 12 months on the maintenance dose. If there is no improvement after a year of maintenance, your allergist will discuss other treatment options with you.  If you aren't responding to allergy shots, it may be because there is not enough dose of the allergen in your vaccine or there are missing allergens that were not identified during your allergy testing. Other reasons could be that there are high levels of the allergen in your environment or major exposure to non-allergic triggers like tobacco smoke.  What Is the Length of Treatment?  Once the maintenance dose is reached, allergy shots are  generally continued for three to five years. The decision to  stop should be discussed with your allergist at that time. Some people may experience a permanent reduction of allergy symptoms. Others may relapse and a longer course of allergy shots can be considered.  What Are the Possible Reactions?  The two types of adverse reactions that can occur with allergy shots are local and systemic. Common local reactions include very mild redness and swelling at the injection site, which can happen immediately or several hours after. A systemic reaction, which is less common, affects the entire body or a particular body system. They are usually mild and typically respond quickly to medications. Signs include increased allergy symptoms such as sneezing, a stuffy nose or hives.  Rarely, a serious systemic reaction called anaphylaxis can develop. Symptoms include swelling in the throat, wheezing, a feeling of tightness in the chest, nausea or dizziness. Most serious systemic reactions develop within 30 minutes of allergy shots. This is why it is strongly recommended you wait in your doctor's office for 30 minutes after your injections. Your allergist is trained to watch for reactions, and his or her staff is trained and equipped with the proper medications to identify and treat them.  Who Should Administer Allergy Shots?  The preferred location for receiving shots is your prescribing allergist's office. Injections can sometimes be given at another facility where the physician and staff are trained to recognize and treat reactions, and have received instructions by your prescribing allergist.

## 2021-08-05 NOTE — Progress Notes (Unsigned)
NEW PATIENT  Date of Service/Encounter:  08/05/21  Consult requested by: Rosanne Sack (Inactive)   Assessment:   Seasonal and perennial allergic rhinitis (weeds, trees, indoor molds, dust mites, and cockroach)  Plan/Recommendations:   1. Seasonal and perennial allergic rhinitis - Testing today showed: weeds, trees, indoor molds, dust mites, and cockroach. - Copy of test results provided.  - Avoidance measures provided. - Stop taking: cetirizine - Continue with: Singulair (montelukast) 10mg  daily and Atrovent (ipratropium) 0.03% one spray per nostril 2-3 times daily as needed (CAN BE OVER DRYING) - Start taking: carbinoxamine 4 mg every 8 hours as needed - You can use an extra dose of the antihistamine, if needed, for breakthrough symptoms.  - Consider nasal saline rinses 1-2 times daily to remove allergens from the nasal cavities as well as help with mucous clearance (this is especially helpful to do before the nasal sprays are given) - Consider allergy shots as a means of long-term control. - Allergy shots "re-train" and "reset" the immune system to ignore environmental allergens and decrease the resulting immune response to those allergens (sneezing, itchy watery eyes, runny nose, nasal congestion, etc).    - Allergy shots improve symptoms in 75-85% of patients.  - We can discuss more at the next appointment if the medications are not working for you.  2. Return in about 3 months (around 11/05/2021).    This note in its entirety was forwarded to the Provider who requested this consultation.  Subjective:   [name] is a 61 y.o. male presenting today for evaluation of  Chief Complaint  Patient presents with   Establish Care    [name] has a history of the following: Patient Active Problem List   Diagnosis Date Noted   Trochanteric bursitis of hip (Right) 03/18/2021   Complex renal cyst 03/18/2021   Impotence 03/18/2021   Primary osteoarthritis of right knee  03/18/2021   Carpal tunnel syndrome 03/18/2021   Cervical radiculopathy 03/18/2021   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   Encounter for surgical aftercare following surgery on the nervous system 03/18/2021   Enlarged prostate 03/18/2021   Gross hematuria 03/18/2021   Impingement syndrome of shoulder region 03/18/2021   Kidney stone 03/18/2021   Lesion of ulnar nerve, right upper limb 03/18/2021   Male erectile dysfunction, unspecified 03/18/2021   Primary erectile dysfunction 03/18/2021   Obesity 03/18/2021   Obstructive sleep apnea (adult) (pediatric) 03/18/2021   Obstructive sleep apnea of adult 03/18/2021   Sprain of sacroiliac joint 03/18/2021   Osteoarthritis of hip (Right) 03/18/2021   Unspecified disorder of refraction 03/18/2021   Vitamin D deficiency 03/18/2021   Pain in right hip 03/18/2021   Encounter for other preprocedural examination 03/18/2021   Encounter for immunization 03/18/2021   Counseling, unspecified 03/18/2021   Pain in left shoulder 03/18/2021   Pain in right elbow 03/18/2021   Gluteal tendinopathy (Right) 03/18/2021   DDD (degenerative disc disease), lumbosacral 03/18/2021   Lumbar facet hypertrophy (Multilevel) (Bilateral) 03/18/2021   Lumbar foraminal stenosis (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, L4-5) (Left: L5-S1) 03/18/2021   Chronic hip pain (1ry area of Pain) (Right) 03/17/2021   Enthesopathy of hip region (Right) 03/17/2021   Chronic groin pain (Right) 03/17/2021   Chronic low back pain (Right) w/o sciatica 03/17/2021   Lumbar facet syndrome (Bilateral) (R>L) 03/17/2021   Chronic shoulder pain (3ry area of Pain) (Bilateral) (R>L) 03/17/2021   Chronic neck pain with history of cervical spinal surgery (4th area of Pain) 03/17/2021  Chronic pain syndrome 03/13/2021   Pharmacologic therapy 03/13/2021   Disorder of skeletal system 03/13/2021   Problems influencing health status 03/13/2021   Chronic knee pain (5th area of Pain) (Right)  11/25/2017   Postlaminectomy syndrome, cervical region 04/12/2013   Chronic low back pain (2ry area of Pain) (Midline) w/o sciatica 02/06/2013   Chronic shoulder pain (Right) 12/30/2012   Hyperlipidemia 10/11/2011   Gout 10/11/2011   Acne 10/11/2011    History obtained from: chart review and patient.  [name] was referred by Rosanne Sack (Inactive).     [name] is a 61 y.o. male presenting for an evaluation of chronic rhinitis . He was previously followed by [facility] for a couple of visits. He is not clear why he is being seen here now.  He reports that has had a history of allergies. He was on generic cetirizine as well as the nasal spray. He was also montelukast. All of these were started around one year ago. It has helped but has not solved the problem. He was tested there, but otherwise never tested in the past.   He did not have allergies as a child. He thinks that her started having allergies around 5 years ago. Around the change of the seasons, nothing would improve. He has not had sinus infections at all.   Skin Symptom History: He has rosacea. He treats with clindamycin cream needed. He thinks that he ends up using a small amount every day.   He has a history of neuropathic pain which is treated with pregablin. This has provided some relief but there is now discussion of doing shoulder replacement if it gets worse. This is not related to a war injury, but instead just bad genetics.   He was in the service for 3 years from 67 through 1984. After that, he came home and worked for the [employer]. 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 He works out and walks in his retirement. He has two girls who are aged 4 and 98.  Otherwise, there is no history of other atopic diseases, including asthma, food allergies, drug allergies, stinging insect allergies, eczema, urticaria, or contact dermatitis. There is no significant infectious  history. Vaccinations are up to date.    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   Encounter for surgical aftercare following surgery on the nervous system 03/18/2021   Enlarged prostate 03/18/2021   Gross hematuria 03/18/2021   Impingement syndrome of shoulder region 03/18/2021   Kidney stone 03/18/2021   Lesion of ulnar nerve, right upper limb 03/18/2021   Male erectile dysfunction, unspecified 03/18/2021   Primary erectile dysfunction 03/18/2021   Obesity 03/18/2021   Obstructive sleep apnea (adult) (pediatric) 03/18/2021   Obstructive sleep apnea of adult 03/18/2021   Sprain of sacroiliac joint 03/18/2021   Osteoarthritis of hip (Right) 03/18/2021   Unspecified disorder of refraction 03/18/2021   Vitamin D deficiency 03/18/2021   Pain in right hip 03/18/2021   Encounter for other preprocedural examination 03/18/2021   Encounter for immunization 03/18/2021   Counseling, unspecified 03/18/2021   Pain in left shoulder 03/18/2021   Pain in right elbow 03/18/2021  Gluteal tendinopathy (Right) 03/18/2021   DDD (degenerative disc disease), lumbosacral 03/18/2021   Lumbar facet hypertrophy (Multilevel) (Bilateral) 03/18/2021   Lumbar foraminal stenosis (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, L4-5) (Left: L5-S1) 03/18/2021   Chronic hip pain (1ry area of Pain) (Right) 03/17/2021   Enthesopathy of hip region (Right) 03/17/2021   Chronic groin pain (Right) 03/17/2021   Chronic low back pain (Right) w/o sciatica 03/17/2021   Lumbar facet syndrome (Bilateral) (R>L) 03/17/2021   Chronic shoulder pain (3ry area of Pain) (Bilateral) (R>L) 03/17/2021   Chronic neck pain with history of  cervical spinal surgery (4th area of Pain) 03/17/2021   Chronic pain syndrome 03/13/2021   Pharmacologic therapy 03/13/2021   Disorder of skeletal system 03/13/2021   Problems influencing health status 03/13/2021   Chronic knee pain (5th area of Pain) (Right) 11/25/2017   Postlaminectomy syndrome, cervical region 04/12/2013   Chronic low back pain (2ry area of Pain) (Midline) w/o sciatica 02/06/2013   Chronic shoulder pain (Right) 12/30/2012   Hyperlipidemia 10/11/2011   Gout 10/11/2011   Acne 10/11/2011    Medication List:  Allergies as of 08/05/2021   No Known Allergies      Medication List        Accurate as of Aug 05, 2021 11:59 PM. If you have any questions, ask your nurse or doctor.          STOP taking these medications    ipratropium 0.06 % nasal spray Commonly known as: ATROVENT Replaced by: ipratropium 0.03 % nasal spray Stopped by: Alfonse Spruce, MD       TAKE these medications    b complex vitamins tablet Take 1 tablet by mouth daily.   calcium citrate 950 (200 Ca) MG tablet Commonly known as: CALCITRATE - dosed in mg elemental calcium Take 200 mg of elemental calcium by mouth daily.   Carbinoxamine Maleate 4 MG Tabs Take 1 tablet (4 mg total) by mouth every 8 (eight) hours as needed. Started by: Alfonse Spruce, MD   cetirizine 10 MG tablet Commonly known as: ZYRTEC Take 10 mg by mouth daily.   cholecalciferol 10 MCG (400 UNIT) Tabs tablet Commonly known as: VITAMIN D3 Take 400 Units by mouth daily.   clindamycin 1 % lotion Commonly known as: CLEOCIN T Apply topically daily.   Fish Oil 1200 MG Caps Take 1,200 mg by mouth daily.   ipratropium 0.03 % nasal spray Commonly known as: ATROVENT Place 1 spray per nostril 2-3 times daily as needed Replaces: ipratropium 0.06 % nasal spray Started by: Alfonse Spruce, MD   ketotifen 0.025 % ophthalmic solution Commonly known as: ZADITOR 1 drop as needed.   montelukast 10  MG tablet Commonly known as: SINGULAIR Take 10 mg by mouth at bedtime. What changed: Another medication with the same name was added. Make sure you understand how and when to take each. Changed by: Alfonse Spruce, MD   montelukast 10 MG tablet Commonly known as: Singulair Take 1 tablet (10 mg total) by mouth at bedtime. What changed: You were already taking a medication with the same name, and this prescription was added. Make sure you understand how and when to take each. Changed by: Alfonse Spruce, MD   pregabalin 225 MG capsule Commonly known as: LYRICA Take 225 mg by mouth 2 (two) times daily.   sildenafil 100 MG tablet Commonly known as: VIAGRA Take 100 mg by mouth as needed for erectile dysfunction.        Birth History: non-contributory  Developmental History: non-contributory  Past Surgical History: Past Surgical History:  Procedure Laterality Date   ANTERIOR CERVICAL DECOMP/DISCECTOMY FUSION  11/19/2011   Procedure: ANTERIOR CERVICAL DECOMPRESSION/DISCECTOMY FUSION 1 LEVEL;  Surgeon: Emilee HeroMark Leonard Dumonski, MD;  Location: [facility] OR;  Service: Orthopedics;  Laterality: Right;  Anterior cervical decompression fusion, cervical 4-5 with instrumentation and allograft.   CARPAL TUNNEL RELEASE  2008   right   CERVICAL FUSION  2003   CIRCUMCISION  4254yrs   colonosocpy     KNEE ARTHROSCOPY  15+yrs ago   right    SHOULDER SURGERY  5836yrs ago and then 2013   x 2   SHOULDER SURGERY  9536yrs ago   left    VASECTOMY  89yrs ago     Family History: Family History  Problem Relation Age of Onset   Cancer Mother        breast   Cancer Sister        breast     Social History: Michale lives at home in an apartment with carpeting throughout the home.  Is around 61 years old.  There is electric heating and heat pump for cooling.  There are no animals inside or outside the home.  There are no dust mite covers on the bedding.  There is no tobacco exposure.  He is currently  retired.  There is no fume, chemical, or dust exposure.  He does not live near an interstate or industrial area.   Review of Systems  Constitutional: Negative.  Negative for chills, fever, malaise/fatigue and weight loss.  HENT: Negative.  Negative for congestion, ear discharge, ear pain and sinus pain.   Eyes:  Negative for pain, discharge and redness.  Respiratory:  Negative for cough, sputum production, shortness of breath and wheezing.   Cardiovascular: Negative.  Negative for chest pain and palpitations.  Gastrointestinal:  Negative for abdominal pain, constipation, diarrhea, heartburn, nausea and vomiting.  Skin: Negative.  Negative for itching and rash.  Neurological:  Negative for dizziness and headaches.  Endo/Heme/Allergies:  Negative for environmental allergies. Does not bruise/bleed easily.      Objective:   Blood pressure 128/86, pulse 78, temperature 98.2 F (36.8 C), resp. rate 16, height 5' 10.5" (1.791 m), weight 215 lb 4 oz (97.6 kg), SpO2 99 %. Body mass index is 30.45 kg/m.     Physical Exam Vitals reviewed.  Constitutional:      Appearance: He is well-developed.     Comments: Very pleasant.   HENT:     Head: Normocephalic and atraumatic.     Right Ear: Tympanic membrane, ear canal and external ear normal. No drainage, swelling or tenderness. Tympanic membrane is not injected, scarred, erythematous, retracted or bulging.     Left Ear: Tympanic membrane, ear canal and external ear normal. No drainage, swelling or tenderness. Tympanic membrane is not injected, scarred, erythematous, retracted or bulging.     Nose: No nasal deformity, septal deviation, mucosal edema or rhinorrhea.     Right Turbinates: Enlarged, swollen and pale.     Left Turbinates: Enlarged, swollen and pale.     Right Sinus: No maxillary sinus tenderness or frontal sinus tenderness.     Left Sinus: No maxillary sinus tenderness or frontal sinus tenderness.     Comments: No nasal polyps.      Mouth/Throat:     Mouth: Mucous membranes are not pale and not dry.     Pharynx: Uvula midline.  Eyes:     General: Lids are normal.  Right eye: No discharge.        Left eye: No discharge.     Conjunctiva/sclera: Conjunctivae normal.     Right eye: Right conjunctiva is not injected. No chemosis.    Left eye: Left conjunctiva is not injected. No chemosis.    Pupils: Pupils are equal, round, and reactive to light.  Cardiovascular:     Rate and Rhythm: Normal rate and regular rhythm.     Heart sounds: Normal heart sounds.  Pulmonary:     Effort: Pulmonary effort is normal. No tachypnea, accessory muscle usage or respiratory distress.     Breath sounds: Normal breath sounds. No wheezing, rhonchi or rales.     Comments: Moving air well in all lung fields. No increased work of breathing noted.  Chest:     Chest wall: No tenderness.  Abdominal:     Tenderness: There is no abdominal tenderness. There is no guarding or rebound.  Lymphadenopathy:     Head:     Right side of head: No submandibular, tonsillar or occipital adenopathy.     Left side of head: No submandibular, tonsillar or occipital adenopathy.     Cervical: No cervical adenopathy.  Skin:    General: Skin is warm.     Capillary Refill: Capillary refill takes less than 2 seconds.     Coloration: Skin is not pale.     Findings: No abrasion, erythema, petechiae or rash. Rash is not papular, urticarial or vesicular.  Neurological:     Mental Status: He is alert.  Psychiatric:        Behavior: Behavior is cooperative.     Diagnostic studies:  Allergy Studies:     Airborne Adult Perc - 08/05/21 1435     Time Antigen Placed 1440    Allergen Manufacturer Waynette Buttery    Location Back    Number of Test 59    1. Control-Buffer 50% Glycerol Negative    2. Control-Histamine 1 mg/ml 2+    3. Albumin saline Negative    4. Bahia Negative    5. French Southern Territories Negative    6. Johnson Negative    7. Kentucky Blue Negative    8. Meadow  Fescue Negative    9. Perennial Rye Negative    10. Sweet Vernal Negative    11. Timothy Negative    12. Cocklebur Negative    13. Burweed Marshelder Negative    14. Ragweed, short Negative    15. Ragweed, Giant Negative    16. Plantain,  English Negative    17. Lamb's Quarters Negative    18. Sheep Sorrell Negative    19. Rough Pigweed Negative    20. Marsh Elder, Rough 2+    21. Mugwort, Common Negative    22. Ash mix Negative    23. Birch mix Negative    24. Beech American Negative    25. Box, Elder Negative    26. Cedar, red 3+    27. Cottonwood, Guinea-Bissau Negative    28. Elm mix Negative    29. Hickory Negative    30. Maple mix Negative    31. Oak, Guinea-[place]issau mix Negative    32. Pecan Pollen Negative    33. Pine mix Negative    34. Sycamore Eastern Negative    35. Walnut, Black Pollen Negative    36. Alternaria alternata Negative    37. Cladosporium Herbarum Negative    38. Aspergillus mix Negative    39. Penicillium mix Negative    40.  Bipolaris sorokiniana (Helminthosporium) Negative    41. Drechslera spicifera (Curvularia) Negative    42. Mucor plumbeus Negative    43. Fusarium moniliforme Negative    44. Aureobasidium pullulans (pullulara) Negative    45. Rhizopus oryzae Negative    46. Botrytis cinera Negative    47. Epicoccum nigrum Negative    48. Phoma betae Negative    49. Candida Albicans Negative    50. Trichophyton mentagrophytes Negative    51. Mite, D Farinae  5,000 AU/ml Negative    52. Mite, D Pteronyssinus  5,000 AU/ml Negative    53. Cat Hair 10,000 BAU/ml Negative    54.  Dog Epithelia Negative    55. Mixed Feathers Negative    56. Horse Epithelia Negative    57. Cockroach, German Negative    58. Mouse Negative    59. Tobacco Leaf Negative             Intradermal - 08/05/21 1508     Time Antigen Placed --   error   Allergen Manufacturer --    Location --    Number of Test --    Control Negative    French Southern Territories Negative    Johnson  Negative    7 Grass Negative    Ragweed mix Negative    Tree mix Negative    Mold 1 Negative    Mold 2 Negative    Mold 3 Negative    Mold 4 1+    Cat Negative    Dog Negative    Cockroach 1+    Mite mix 1+             Allergy testing results were read and interpreted by myself, documented by clinical staff.         Malachi Bonds, MD [facility]

## 2021-08-06 ENCOUNTER — Encounter: Payer: Self-pay | Admitting: Allergy & Immunology

## 2021-08-06 MED ORDER — MONTELUKAST SODIUM 10 MG PO TABS: 10.0000 mg | ORAL_TABLET | Freq: Every day | ORAL | 5 refills | Status: DC

## 2021-08-06 MED ORDER — IPRATROPIUM BROMIDE 0.03 % NA SOLN: NASAL | 5 refills | Status: DC

## 2021-08-06 MED ORDER — CARBINOXAMINE MALEATE 4 MG PO TABS: 4.0000 mg | ORAL_TABLET | Freq: Three times a day (TID) | ORAL | 2 refills | Status: DC | PRN

## 2021-08-08 ENCOUNTER — Telehealth: Payer: Self-pay

## 2021-08-08 MED ORDER — CHLORPHENIRAMINE MALEATE 4 MG PO TABS
4.0000 mg | ORAL_TABLET | Freq: Three times a day (TID) | ORAL | 2 refills | Status: DC | PRN
Start: 2021-08-08 — End: 2022-05-07

## 2021-08-08 NOTE — Addendum Note (Signed)
Addended by: Areta Haber B on: 08/08/2021 05:15 PM   Modules accepted: Orders

## 2021-08-08 NOTE — Telephone Encounter (Signed)
Chlorpheniramine comes in a 40mg  formulation? I guess we can do that one daily as needed.   Unless you meant 4mg , in which case I would like Q8 hrs PRN.  , MD [facility]

## 2021-08-08 NOTE — Telephone Encounter (Signed)
Lora, Pharmacist called in - DOB verified - advising Carbinoxamine Maleate 4 mg is not offered through their pharmacy. She stated the following are 1st Generation options: Hydroxyzine 10mg  or 25 mg                                                                                                        Chlorpheniramine 40mg 

## 2021-08-08 NOTE — Telephone Encounter (Signed)
Ooooops - I meant Chlorpheniramine 4 mg !!!! Sorry.   I will send In Chlorpheniramine 4 mg 1 tablet by mouth Q8 hrs PRN to the pharmacy on file.  Thanks.

## 2021-11-06 ENCOUNTER — Ambulatory Visit (INDEPENDENT_AMBULATORY_CARE_PROVIDER_SITE_OTHER): Payer: No Typology Code available for payment source | Admitting: Allergy & Immunology

## 2021-11-06 ENCOUNTER — Encounter: Payer: Self-pay | Admitting: Allergy & Immunology

## 2021-11-06 VITALS — BP 116/68 | HR 68 | Temp 98.3°F | Resp 16

## 2021-11-06 DIAGNOSIS — L719 Rosacea, unspecified: Secondary | ICD-10-CM

## 2021-11-06 DIAGNOSIS — J3089 Other allergic rhinitis: Secondary | ICD-10-CM | POA: Diagnosis not present

## 2021-11-06 DIAGNOSIS — J302 Other seasonal allergic rhinitis: Secondary | ICD-10-CM

## 2021-11-06 NOTE — Patient Instructions (Addendum)
1. Seasonal and perennial allergic rhinitis - Previous testing was positive to: weeds, trees, indoor molds, dust mites, and cockroach. - Copy of test results provided.  - Avoidance measures provided. - Continue with: Singulair (montelukast) 10mg  daily and Atrovent (ipratropium) 0.03% one spray per nostril 2-3 times daily as needed (CAN BE OVER DRYING) - Continue with: chlorpheniramine 4 mg every 8 hours as needed - You can use an extra dose of the antihistamine, if needed, for breakthrough symptoms.  - Consider nasal saline rinses 1-2 times daily to remove allergens from the nasal cavities as well as help with mucous clearance (this is especially helpful to do before the nasal sprays are given) - Make an appointment to start allergy shots. - They can take a few months to start working, but they lead to permanent immune changes that can get you off of medications completely.   2. Return in about 6 months (around 05/07/2022).    Please inform 05/09/2022 of any Emergency Department visits, hospitalizations, or changes in symptoms. Call us before going to the ED for breathing or allergy symptoms since we might be able to fit you in for a sick visit. Feel free to contact us anytime with any questions, problems, or concerns.  It was a pleasure to see you today!  Websites that have reliable patient information: 1. American Academy of Asthma, Allergy, and Immunology: www.aaaai.org 2. Food Allergy Research and Education (FARE): foodallergy.org 3. Mothers of Asthmatics: http://www.asthmacommunitynetwork.org 4. American College of Allergy, Asthma, and Immunology: www.acaai.org   COVID-19 Vaccine Information can be found at: [place] For questions related to vaccine distribution or appointments, please email [email or web address] or call [phone].   We realize that you might be concerned about having an allergic reaction to the COVID19  vaccines. To help with that concern, WE ARE OFFERING THE COVID19 VACCINES IN OUR OFFICE! Ask the front desk for dates!     "Like" [phone] on Facebook and Instagram for our latest updates!      A healthy democracy works best when [place] participate! Make sure you are registered to vote! If you have moved or changed any of your contact information, you will need to get this updated before voting!  In some cases, you MAY be able to register to vote online: [employer]

## 2021-11-06 NOTE — Progress Notes (Signed)
FOLLOW UP  Date of Service/Encounter:  11/06/21   Assessment:   Seasonal and perennial allergic rhinitis (weeds, trees, indoor molds, dust mites, and cockroach)  Plan/Recommendations:   1. Seasonal and perennial allergic rhinitis - Previous testing was positive to: weeds, trees, indoor molds, dust mites, and cockroach. - Copy of test results provided.  - Avoidance measures provided. - Continue with: Singulair (montelukast) 10mg  daily and Atrovent (ipratropium) 0.03% one spray per nostril 2-3 times daily as needed (CAN BE OVER DRYING) - Continue with: chlorpheniramine 4 mg every 8 hours as needed - You can use an extra dose of the antihistamine, if needed, for breakthrough symptoms.  - Consider nasal saline rinses 1-2 times daily to remove allergens from the nasal cavities as well as help with mucous clearance (this is especially helpful to do before the nasal sprays are given) - Make an appointment to start allergy shots. - They can take a few months to start working, but they lead to permanent immune changes that can get you off of medications completely.   2. Return in about 6 months (around 05/07/2022).     Subjective:   [name] is a 61 y.o. male presenting today for follow up of  Chief Complaint  Patient presents with   Follow-up    [name] has a history of the following: Patient Active Problem List   Diagnosis Date Noted   Trochanteric bursitis of hip (Right) 03/18/2021   Complex renal cyst 03/18/2021   Impotence 03/18/2021   Primary osteoarthritis of right knee 03/18/2021   Carpal tunnel syndrome 03/18/2021   Cervical radiculopathy 03/18/2021   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   Encounter for surgical aftercare following surgery on the nervous system 03/18/2021   Enlarged prostate 03/18/2021   Gross hematuria 03/18/2021   Impingement syndrome of shoulder region 03/18/2021   Kidney stone 03/18/2021   Lesion of ulnar nerve,  right upper limb 03/18/2021   Male erectile dysfunction, unspecified 03/18/2021   Primary erectile dysfunction 03/18/2021   Obesity 03/18/2021   Obstructive sleep apnea (adult) (pediatric) 03/18/2021   Obstructive sleep apnea of adult 03/18/2021   Sprain of sacroiliac joint 03/18/2021   Osteoarthritis of hip (Right) 03/18/2021   Unspecified disorder of refraction 03/18/2021   Vitamin D deficiency 03/18/2021   Pain in right hip 03/18/2021   Encounter for other preprocedural examination 03/18/2021   Encounter for immunization 03/18/2021   Counseling, unspecified 03/18/2021   Pain in left shoulder 03/18/2021   Pain in right elbow 03/18/2021   Gluteal tendinopathy (Right) 03/18/2021   DDD (degenerative disc disease), lumbosacral 03/18/2021   Lumbar facet hypertrophy (Multilevel) (Bilateral) 03/18/2021   Lumbar foraminal stenosis (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, L4-5) (Left: L5-S1) 03/18/2021   Chronic hip pain (1ry area of Pain) (Right) 03/17/2021   Enthesopathy of hip region (Right) 03/17/2021   Chronic groin pain (Right) 03/17/2021   Chronic low back pain (Right) w/o sciatica 03/17/2021   Lumbar facet syndrome (Bilateral) (R>L) 03/17/2021   Chronic shoulder pain (3ry area of Pain) (Bilateral) (R>L) 03/17/2021   Chronic neck pain with history of cervical spinal surgery (4th area of Pain) 03/17/2021   Chronic pain syndrome 03/13/2021   Pharmacologic therapy 03/13/2021   Disorder of skeletal system 03/13/2021   Problems influencing health status 03/13/2021   Chronic knee pain (5th area of Pain) (Right) 11/25/2017   Postlaminectomy syndrome, cervical region 04/12/2013   Chronic low back pain (2ry area of Pain) (Midline) w/o sciatica 02/06/2013   Chronic  shoulder pain (Right) 12/30/2012   Hyperlipidemia 10/11/2011   Gout 10/11/2011   Acne 10/11/2011    History obtained from: chart review and patient.  [name] is a 61 y.o. male presenting for a follow up visit.  He was last seen in May 2023.  At that  time, he had testing that was positive to multiple indoor and outdoor allergens.  We stopped his cetirizine and continued Singulair as well as Atrovent.  We started carbinoxamine 4 mg every 8 hours as needed.  We talked about allergy shots for long-term control.  Since last visit, he has been OK. He has more good days than bad days. He thinks that things have been better. It was worse when I saw him. He is on chlorpheniramine 4mg  which he takes 3 times daily.  He remains on his nose spray as well as Singulair.  He is open to allergy shots. Ideally, he would like to stop using medications.  He does live locally and would be able to make it in for weekly shots.  He has never had allergy shots in the past.  He would like to learn more about them.  [place] the .  [place] able [place] and [place] around [place] at [employer].  Otherwise, there have been no changes to his past medical history, surgical history, family history, or social history.    Review of Systems  Constitutional: Negative.  Negative for fever, malaise/fatigue and weight loss.  HENT: Negative.  Negative for congestion, ear discharge, ear pain and sinus pain.   Eyes:  Negative for pain, discharge and redness.  Respiratory:  Negative for cough, sputum production, shortness of breath and wheezing.   Cardiovascular: Negative.  Negative for chest pain and palpitations.  Gastrointestinal:  Negative for abdominal pain, constipation, diarrhea, heartburn, nausea and vomiting.  Skin: Negative.  Negative for itching and rash.  Neurological:  Negative for dizziness and headaches.  Endo/Heme/Allergies:  Negative for environmental allergies. Does not bruise/bleed easily.       Objective:   Blood pressure 116/68, pulse 68, temperature 98.3 F (36.8 C), resp. rate 16, SpO2 99 %. There is no height or weight on file to calculate BMI.    Physical Exam Vitals reviewed.  Constitutional:       Appearance: He is well-developed.     Comments: Very pleasant.   HENT:     Head: Normocephalic and atraumatic.     Right Ear: Tympanic membrane, ear canal and external ear normal. No drainage, swelling or tenderness. Tympanic membrane is not injected, scarred, erythematous, retracted or bulging.     Left Ear: Tympanic membrane, ear canal and external ear normal. No drainage, swelling or tenderness. Tympanic membrane is not injected, scarred, erythematous, retracted or bulging.     Nose: Mucosal edema and rhinorrhea present. No nasal deformity or septal deviation.     Right Turbinates: Enlarged, swollen and pale.     Left Turbinates: Enlarged, swollen and pale.     Right Sinus: No maxillary sinus tenderness or frontal sinus tenderness.     Left Sinus: No maxillary sinus tenderness or frontal sinus tenderness.     Comments: No nasal polyps.     Mouth/Throat:     Mouth: Mucous membranes are not pale and not dry.     Pharynx: Uvula midline.  Eyes:     General: Lids are normal.        Right eye: No discharge.  Left eye: No discharge.     Conjunctiva/sclera: Conjunctivae normal.     Right eye: Right conjunctiva is not injected. No chemosis.    Left eye: Left conjunctiva is not injected. No chemosis.    Pupils: Pupils are equal, round, and reactive to light.  Cardiovascular:     Rate and Rhythm: Normal rate and regular rhythm.     Heart sounds: Normal heart sounds.  Pulmonary:     Effort: Pulmonary effort is normal. No tachypnea, accessory muscle usage or respiratory distress.     Breath sounds: Normal breath sounds. No wheezing, rhonchi or rales.     Comments: Moving air well in all lung fields. No increased work of breathing noted.  Chest:     Chest wall: No tenderness.  Lymphadenopathy:     Head:     Right side of head: No submandibular, tonsillar or occipital adenopathy.     Left side of head: No submandibular, tonsillar or occipital adenopathy.     Cervical: No cervical  adenopathy.  Skin:    General: Skin is warm.     Capillary Refill: Capillary refill takes less than 2 seconds.     Coloration: Skin is not pale.     Findings: No abrasion, erythema, petechiae or rash. Rash is not papular, urticarial or vesicular.  Neurological:     Mental Status: He is alert.  Psychiatric:        Behavior: Behavior is cooperative.      Diagnostic studies: none     Glenn Bonds, MD  [facility]

## 2021-11-11 DIAGNOSIS — J301 Allergic rhinitis due to pollen: Secondary | ICD-10-CM | POA: Diagnosis not present

## 2021-11-11 NOTE — Progress Notes (Signed)
Aeroallergen Immunotherapy   Ordering Provider: Dr. Malachi Bonds   Patient Details  Name: [name]  MRN: [id number]  Date of Birth: 1961-03-02   Order 1 of 2   Vial Label: T/W   0.2 ml (Volume)  1:20 Concentration -- Michail Jewels elder, Rough*  0.5 ml (Volume)  1:20 Concentration -- Weed Mix*  0.3 ml (Volume)  1:10 Concentration -- Cedar, red  0.8 ml (Volume)   AU Concentration -- Mite Mix (DF 5,000 & DP 5,000)    1.8  ml Extract Subtotal  3.2  ml Diluent  5.0  ml Maintenance Total   Schedule:  B  Silver Vial (1:1,000,000):  Blue Vial (1:100,000): Schedule B (6 doses)  Yellow Vial (1:10,000): Schedule B (6 doses)  Green Vial (1:1,000): Schedule B (6 doses)  Red Vial (1:100): Schedule A (14 doses)   Special Instructions: none

## 2021-11-11 NOTE — Progress Notes (Addendum)
EXP 11/12/22

## 2021-11-11 NOTE — Progress Notes (Signed)
Aeroallergen Immunotherapy   Ordering Provider: Dr. Malachi Bonds   Patient Details  Name: [name]  MRN: [id number]  Date of Birth: 10-11-1960   Order 2 of 2   Vial Label: Molds/CR   0.2 ml (Volume)  1:10 Concentration -- Fusarium moniliforme  0.2 ml (Volume)  1:40 Concentration -- Aureobasidium pullulans  0.2 ml (Volume)  1:10 Concentration -- Rhizopus oryzae  0.4 ml (Volume)  1:20 Concentration -- Cockroach, German    1.0  ml Extract Subtotal  4.0  ml Diluent  5.0  ml Maintenance Total   Schedule:  B  Silver Vial (1:1,000,000):    Blue Vial (1:100,000): Schedule B (6 doses)  Yellow Vial (1:10,000): Schedule B (6 doses)  Green Vial (1:1,000): Schedule B (6 doses)  Red Vial (1:100): Schedule A (14 doses)   Special Instructions: none

## 2021-11-12 DIAGNOSIS — J302 Other seasonal allergic rhinitis: Secondary | ICD-10-CM

## 2021-12-02 ENCOUNTER — Ambulatory Visit (INDEPENDENT_AMBULATORY_CARE_PROVIDER_SITE_OTHER): Payer: No Typology Code available for payment source | Admitting: *Deleted

## 2021-12-02 DIAGNOSIS — J309 Allergic rhinitis, unspecified: Secondary | ICD-10-CM | POA: Diagnosis not present

## 2021-12-02 NOTE — Progress Notes (Signed)
Immunotherapy   Patient Details  Name: DEMARIAN EPPS MRN: [id number] Date of Birth: 09-01-1960  12/02/2021  Honor Junes Shaneyfelt started injections for  T-W, MOLDS-CR Following schedule: B  Frequency:1 time per week Epi-Pen:Epi-Pen Available  Consent signed and patient instructions given. Patient started allergy injections and received 0.79mL of T-W in the RUA and 0.23mL of MOLDS-CR in the LUA. Patient waited 30 minutes and did not experience any issues.    Carine Nordgren Fernandez-Vernon 12/02/2021, 3:40 PM

## 2021-12-09 ENCOUNTER — Ambulatory Visit (INDEPENDENT_AMBULATORY_CARE_PROVIDER_SITE_OTHER): Payer: No Typology Code available for payment source

## 2021-12-09 DIAGNOSIS — J309 Allergic rhinitis, unspecified: Secondary | ICD-10-CM | POA: Diagnosis not present

## 2021-12-16 ENCOUNTER — Ambulatory Visit (INDEPENDENT_AMBULATORY_CARE_PROVIDER_SITE_OTHER): Payer: No Typology Code available for payment source | Admitting: *Deleted

## 2021-12-16 DIAGNOSIS — J309 Allergic rhinitis, unspecified: Secondary | ICD-10-CM

## 2021-12-23 ENCOUNTER — Ambulatory Visit (INDEPENDENT_AMBULATORY_CARE_PROVIDER_SITE_OTHER): Payer: No Typology Code available for payment source

## 2021-12-23 DIAGNOSIS — J309 Allergic rhinitis, unspecified: Secondary | ICD-10-CM

## 2021-12-30 ENCOUNTER — Ambulatory Visit (INDEPENDENT_AMBULATORY_CARE_PROVIDER_SITE_OTHER): Payer: No Typology Code available for payment source | Admitting: *Deleted

## 2021-12-30 DIAGNOSIS — J309 Allergic rhinitis, unspecified: Secondary | ICD-10-CM | POA: Diagnosis not present

## 2022-01-06 ENCOUNTER — Ambulatory Visit (INDEPENDENT_AMBULATORY_CARE_PROVIDER_SITE_OTHER): Payer: No Typology Code available for payment source | Admitting: *Deleted

## 2022-01-06 DIAGNOSIS — J309 Allergic rhinitis, unspecified: Secondary | ICD-10-CM | POA: Diagnosis not present

## 2022-01-13 ENCOUNTER — Ambulatory Visit (INDEPENDENT_AMBULATORY_CARE_PROVIDER_SITE_OTHER): Payer: No Typology Code available for payment source

## 2022-01-13 DIAGNOSIS — J309 Allergic rhinitis, unspecified: Secondary | ICD-10-CM | POA: Diagnosis not present

## 2022-01-20 ENCOUNTER — Ambulatory Visit (INDEPENDENT_AMBULATORY_CARE_PROVIDER_SITE_OTHER): Payer: No Typology Code available for payment source

## 2022-01-20 DIAGNOSIS — J309 Allergic rhinitis, unspecified: Secondary | ICD-10-CM | POA: Diagnosis not present

## 2022-01-27 ENCOUNTER — Ambulatory Visit (INDEPENDENT_AMBULATORY_CARE_PROVIDER_SITE_OTHER): Payer: No Typology Code available for payment source

## 2022-01-27 DIAGNOSIS — J309 Allergic rhinitis, unspecified: Secondary | ICD-10-CM

## 2022-02-03 ENCOUNTER — Ambulatory Visit (INDEPENDENT_AMBULATORY_CARE_PROVIDER_SITE_OTHER): Payer: No Typology Code available for payment source | Admitting: *Deleted

## 2022-02-03 DIAGNOSIS — J309 Allergic rhinitis, unspecified: Secondary | ICD-10-CM

## 2022-02-10 ENCOUNTER — Ambulatory Visit (INDEPENDENT_AMBULATORY_CARE_PROVIDER_SITE_OTHER): Payer: No Typology Code available for payment source

## 2022-02-10 DIAGNOSIS — J309 Allergic rhinitis, unspecified: Secondary | ICD-10-CM

## 2022-02-17 ENCOUNTER — Ambulatory Visit (INDEPENDENT_AMBULATORY_CARE_PROVIDER_SITE_OTHER): Payer: No Typology Code available for payment source

## 2022-02-17 DIAGNOSIS — J309 Allergic rhinitis, unspecified: Secondary | ICD-10-CM | POA: Diagnosis not present

## 2022-02-24 ENCOUNTER — Ambulatory Visit (INDEPENDENT_AMBULATORY_CARE_PROVIDER_SITE_OTHER): Payer: No Typology Code available for payment source

## 2022-02-24 DIAGNOSIS — J309 Allergic rhinitis, unspecified: Secondary | ICD-10-CM

## 2022-03-04 ENCOUNTER — Ambulatory Visit (INDEPENDENT_AMBULATORY_CARE_PROVIDER_SITE_OTHER): Payer: No Typology Code available for payment source | Admitting: *Deleted

## 2022-03-04 DIAGNOSIS — J309 Allergic rhinitis, unspecified: Secondary | ICD-10-CM

## 2022-03-10 ENCOUNTER — Ambulatory Visit (INDEPENDENT_AMBULATORY_CARE_PROVIDER_SITE_OTHER): Payer: No Typology Code available for payment source

## 2022-03-10 DIAGNOSIS — J309 Allergic rhinitis, unspecified: Secondary | ICD-10-CM | POA: Diagnosis not present

## 2022-03-17 ENCOUNTER — Ambulatory Visit (INDEPENDENT_AMBULATORY_CARE_PROVIDER_SITE_OTHER): Payer: No Typology Code available for payment source

## 2022-03-17 DIAGNOSIS — J309 Allergic rhinitis, unspecified: Secondary | ICD-10-CM

## 2022-03-18 ENCOUNTER — Telehealth: Payer: Self-pay | Admitting: Pain Medicine

## 2022-03-18 NOTE — Telephone Encounter (Signed)
PT stated that he had left some images for doctor to look at. PT wanted to know if he had looked at images yet. PT wanted to see what's the next step is. Please give patient a call. Thanks

## 2022-03-19 NOTE — Telephone Encounter (Signed)
Last appt 04/2021. I instructed him to schedule appt. Will you call him to schedule?

## 2022-03-25 ENCOUNTER — Ambulatory Visit (INDEPENDENT_AMBULATORY_CARE_PROVIDER_SITE_OTHER): Payer: No Typology Code available for payment source

## 2022-03-25 DIAGNOSIS — J309 Allergic rhinitis, unspecified: Secondary | ICD-10-CM | POA: Diagnosis not present

## 2022-03-31 ENCOUNTER — Ambulatory Visit (INDEPENDENT_AMBULATORY_CARE_PROVIDER_SITE_OTHER): Payer: No Typology Code available for payment source

## 2022-03-31 DIAGNOSIS — J309 Allergic rhinitis, unspecified: Secondary | ICD-10-CM | POA: Diagnosis not present

## 2022-04-07 ENCOUNTER — Telehealth: Payer: Self-pay

## 2022-04-07 ENCOUNTER — Ambulatory Visit (INDEPENDENT_AMBULATORY_CARE_PROVIDER_SITE_OTHER): Payer: No Typology Code available for payment source | Admitting: Internal Medicine

## 2022-04-07 ENCOUNTER — Other Ambulatory Visit: Payer: Self-pay | Admitting: Pain Medicine

## 2022-04-07 DIAGNOSIS — M1611 Unilateral primary osteoarthritis, right hip: Secondary | ICD-10-CM

## 2022-04-07 DIAGNOSIS — M76891 Other specified enthesopathies of right lower limb, excluding foot: Secondary | ICD-10-CM

## 2022-04-07 DIAGNOSIS — M25551 Pain in right hip: Secondary | ICD-10-CM

## 2022-04-07 DIAGNOSIS — G8929 Other chronic pain: Secondary | ICD-10-CM

## 2022-04-07 DIAGNOSIS — M7061 Trochanteric bursitis, right hip: Secondary | ICD-10-CM

## 2022-04-07 DIAGNOSIS — J309 Allergic rhinitis, unspecified: Secondary | ICD-10-CM

## 2022-04-07 DIAGNOSIS — M67951 Unspecified disorder of synovium and tendon, right thigh: Secondary | ICD-10-CM

## 2022-04-07 NOTE — Telephone Encounter (Signed)
He was here on 04/10/21. The check out states he is supposed to get an MRI at the [place]. HE states he brought the MRI disc to [place] in September. I told him we have no way of looking at the disc. He said he was unable to get anything written, it is all on the disc. He wants to have a procedure. I received the referral from the [place] but I dont know how to proceed. Please advise.

## 2022-04-12 NOTE — Progress Notes (Unsigned)
PROVIDER NOTE: Interpretation of information contained herein should be left to medically-trained personnel. Specific patient instructions are provided elsewhere under "Patient Instructions" section of medical record. This document was created in part using STT-dictation technology, any transcriptional errors that may result from this process are unintentional.  Patient: [name] Type: Established DOB: 1961/01/02 MRN: [id number] PCP: Jenne Pane (Inactive)  Service: Procedure DOS: 04/14/2022 Setting: Ambulatory Location: Ambulatory outpatient facility Delivery: Face-to-face Provider: Gaspar Cola, MD Specialty: Interventional Pain Management Specialty designation: 09 Location: Outpatient facility Ref. Prov.: No ref. provider found       Health-care intervention ([facility] )    Procedure: Hip & bursae injection #2  Laterality: Right (-RT)  Bursae: Trochanteric  Laterality: Right (-RT)  Approach: Percutaneous posterolateral approach. Level: Lower pelvic and hip joint level.  Imaging: Fluoroscopy-guided         Anesthesia: Local anesthesia (1-2% Lidocaine) Anxiolysis: None                 Sedation: No Sedation                       DOS: 04/14/2022  Performed by: Gaspar Cola, MD  Purpose: Diagnostic/Therapeutic Indications: Hip pain severe enough to impact quality of life or function. Rationale (medical necessity): procedure needed and proper for the diagnosis and/or treatment of [name] medical symptoms and needs. 1. Chronic hip pain (1ry area of Pain) (Right)   2. Chronic groin pain (Right)   3. Osteoarthritis of hip (Right)   4. Trochanteric bursitis of hip (Right)    NAS-11 Pain score:   Pre-procedure: 6 /10   Post-procedure: 3  (moving)/10      Target: Intra-articular aspect of the hip joint & peri-articular bursae Region: Hip joint proper. Femoral region Procedure Type: Percutaneous injection   Position / Prep /  Materials:  Position: Lateral Decubitus with affected side up  Prep solution: DuraPrep (Iodine Povacrylex [0.7% available iodine] and Isopropyl Alcohol, 74% w/w) Prep Area:  Entire Posterolateral hip area. Materials:  Tray: Block tray Needle(s):  Type: Spinal  Gauge (G): 22  Length: 7-in  Qty: 1  Pre-op H&P Assessment:  [name] is a 62 y.o. (year old), male patient, seen today for interventional treatment. He  has a past surgical history that includes Shoulder surgery (30yrs ago and then 2013); Carpal tunnel release (2008); Cervical fusion (2003); Knee arthroscopy (15+yrs ago); Vasectomy (42yrs ago); Shoulder surgery (31yrs ago); colonosocpy; Circumcision (45yrs); and Anterior cervical decomp/discectomy fusion (11/19/2011). [name] has a current medication list which includes the following prescription(s): b complex vitamins, calcium citrate, chlorpheniramine, cholecalciferol, clindamycin, ipratropium, ketotifen, montelukast, fish oil, pregabalin, and sildenafil. His primarily concern today is the Hip Pain (right)  Initial Vital Signs:  Pulse/HCG Rate: (!) 56  Temp: 98.1 F (36.7 C) Resp: 16 BP: 127/85 SpO2: 100 %  BMI: Estimated body mass index is 29.56 kg/m as calculated from the following:   Height as of this encounter: 5' 10.5" (1.791 m).   Weight as of this encounter: 209 lb (94.8 kg).  Risk Assessment: Allergies: Reviewed. He has No Known Allergies.  Allergy Precautions: None required Coagulopathies: Reviewed. None identified.  Blood-thinner therapy: None at this time Active Infection(s): Reviewed. None identified. [name] is afebrile  Site Confirmation: [name] was asked to confirm the procedure and laterality before marking the site Procedure checklist: Completed Consent: Before the procedure and under the influence of no sedative(s), amnesic(s), or anxiolytics, the patient  was informed of the treatment options, risks and possible complications. To fulfill our  ethical and legal obligations, as recommended by the American Medical Association's Code of Ethics, I have informed the patient of my clinical impression; the nature and purpose of the treatment or procedure; the risks, benefits, and possible complications of the intervention; the alternatives, including doing nothing; the risk(s) and benefit(s) of the alternative treatment(s) or procedure(s); and the risk(s) and benefit(s) of doing nothing. The patient was provided information about the general risks and possible complications associated with the procedure. These may include, but are not limited to: failure to achieve desired goals, infection, bleeding, organ or nerve damage, allergic reactions, paralysis, and death. In addition, the patient was informed of those risks and complications associated to the procedure, such as failure to decrease pain; infection; bleeding; organ or nerve damage with subsequent damage to sensory, motor, and/or autonomic systems, resulting in permanent pain, numbness, and/or weakness of one or several areas of the body; allergic reactions; (i.e.: anaphylactic reaction); and/or death. Furthermore, the patient was informed of those risks and complications associated with the medications. These include, but are not limited to: allergic reactions (i.e.: anaphylactic or anaphylactoid reaction(s)); adrenal axis suppression; blood sugar elevation that in diabetics may result in ketoacidosis or comma; water retention that in patients with history of congestive heart failure may result in shortness of breath, pulmonary edema, and decompensation with resultant heart failure; weight gain; swelling or edema; medication-induced neural toxicity; particulate matter embolism and blood vessel occlusion with resultant organ, and/or nervous system infarction; and/or aseptic necrosis of one or more joints. Finally, the patient was informed that Medicine is not an exact science; therefore, there is also  the possibility of unforeseen or unpredictable risks and/or possible complications that may result in a catastrophic outcome. The patient indicated having understood very clearly. We have given the patient no guarantees and we have made no promises. Enough time was given to the patient to ask questions, all of which were answered to the patient's satisfaction. [name] has indicated that he wanted to continue with the procedure. Attestation: I, the ordering provider, attest that I have discussed with the patient the benefits, risks, side-effects, alternatives, likelihood of achieving goals, and potential problems during recovery for the procedure that I have provided informed consent. Date  Time: 04/14/2022 12:25 PM  Pre-Procedure Preparation:  Monitoring: As per clinic protocol. Respiration, ETCO2, SpO2, BP, heart rate and rhythm monitor placed and checked for adequate function Safety Precautions: Patient was assessed for positional comfort and pressure points before starting the procedure. Time-out: I initiated and conducted the "Time-out" before starting the procedure, as per protocol. The patient was asked to participate by confirming the accuracy of the "Time Out" information. Verification of the correct person, site, and procedure were performed and confirmed by me, the nursing staff, and the patient. "Time-out" conducted as per Joint Commission's Universal Protocol (UP.01.01.01). Time: 1247  Description/Narrative of Procedure:          Rationale (medical necessity): procedure needed and proper for the diagnosis and/or treatment of the patient's medical symptoms and needs. Procedural Technique Safety Precautions: Aspiration looking for blood return was conducted prior to all injections. At no point did we inject any substances, as a needle was being advanced. No attempts were made at seeking any paresthesias. Safe injection practices and needle disposal techniques used. Medications properly checked  for expiration dates. SDV (single dose vial) medications used. Description of the Procedure: Protocol guidelines were followed. The patient  was assisted into a comfortable position. The target area was identified and the area prepped in the usual manner. Skin & deeper tissues infiltrated with local anesthetic. Appropriate amount of time allowed to pass for local anesthetics to take effect. The procedure needles were then advanced to the target area. Proper needle placement secured. Negative aspiration confirmed. Solution injected in intermittent fashion, asking for systemic symptoms every 0.5cc of injectate. The needles were then removed and the area cleansed, making sure to leave some of the prepping solution back to take advantage of its long term bactericidal properties.  Technical description of procedure:  Skin & deeper tissues infiltrated with local anesthetic. Appropriate amount of time allowed to pass for local anesthetics to take effect. The procedure needles were then advanced to the target area. Proper needle placement secured. Negative aspiration confirmed. Solution injected in intermittent fashion, asking for systemic symptoms every 0.5cc of injectate. The needles were then removed and the area cleansed, making sure to leave some of the prepping solution back to take advantage of its long term bactericidal properties.             Vitals:   04/14/22 1224 04/14/22 1245 04/14/22 1250 04/14/22 1257  BP: 127/85 121/65 114/64 120/65  Pulse:  (!) 56 (!) 54 (!) 56  Resp: 16 16 16 17   Temp: 98.1 F (36.7 C)     SpO2: 100% 100% 100% 100%  Weight: 209 lb (94.8 kg)     Height: 5' 10.5" (1.791 m)        Start Time: 1247 hrs. End Time: 1256 hrs.  Imaging Guidance (Non-Spinal):          Type of Imaging Technique: Fluoroscopy Guidance (Non-Spinal) Indication(s): Assistance in needle guidance and placement for procedures requiring needle placement in or near specific anatomical locations  not easily accessible without such assistance. Exposure Time: Please see nurses notes. Contrast: Before injecting any contrast, we confirmed that the patient did not have an allergy to iodine, shellfish, or radiological contrast. Once satisfactory needle placement was completed at the desired level, radiological contrast was injected. Contrast injected under live fluoroscopy. No contrast complications. See chart for type and volume of contrast used. Fluoroscopic Guidance: I was personally present during the use of fluoroscopy. "Tunnel Vision Technique" used to obtain the best possible view of the target area. Parallax error corrected before commencing the procedure. "Direction-depth-direction" technique used to introduce the needle under continuous pulsed fluoroscopy. Once target was reached, antero-posterior, oblique, and lateral fluoroscopic projection used confirm needle placement in all planes. Images permanently stored in EMR. Interpretation: I personally interpreted the imaging intraoperatively. Adequate needle placement confirmed in multiple planes. Appropriate spread of contrast into desired area was observed. No evidence of afferent or efferent intravascular uptake. Permanent images saved into the patient's record.  Post-operative Assessment:  Post-procedure Vital Signs:  Pulse/HCG Rate: (!) 56  Temp: 98.1 F (36.7 C) Resp: 17 BP: 120/65 SpO2: 100 %  EBL: None  Complications: No immediate post-treatment complications observed by team, or reported by patient.  Note: The patient tolerated the entire procedure well. A repeat set of vitals were taken after the procedure and the patient was kept under observation following institutional policy, for this type of procedure. Post-procedural neurological assessment was performed, showing return to baseline, prior to discharge. The patient was provided with post-procedure discharge instructions, including a section on how to identify potential  problems. Should any problems arise concerning this procedure, the patient was given instructions to immediately contact us, at  any time, without hesitation. In any case, we plan to contact the patient by telephone for a follow-up status report regarding this interventional procedure.  Comments:  No additional relevant information.  Plan of Care (POC)  Orders:  Orders Placed This Encounter  Procedures   HIP INJECTION    Scheduling Instructions:     Side: Right-sided     Sedation: Patient's choice.     Timeframe: Today   DG PAIN [facility] C-ARM 1-60 MIN NO REPORT    Intraoperative interpretation by procedural physician at [facility].    Standing Status:   Standing    Number of Occurrences:   1    Order Specific Question:   Reason for exam:    Answer:   Assistance in needle guidance and placement for procedures requiring needle placement in or near specific anatomical locations not easily accessible without such assistance.   Informed Consent Details: Physician/Practitioner Attestation; Transcribe to consent form and obtain patient signature    Nursing Order: Transcribe to consent form and obtain patient signature. Note: Always confirm laterality of pain with [name], before procedure.    Order Specific Question:   Physician/Practitioner attestation of informed consent for procedure/surgical case    Answer:   I, the physician/practitioner, attest that I have discussed with the patient the benefits, risks, side effects, alternatives, likelihood of achieving goals and potential problems during recovery for the procedure that I have provided informed consent.    Order Specific Question:   Procedure    Answer:   Hip injection    Order Specific Question:   Physician/Practitioner performing the procedure    Answer:   Evo Aderman A. Laban Emperor, MD    Order Specific Question:   Indication/Reason    Answer:   Hip Joint Pain (Arthralgia)   Provide equipment / supplies at bedside    Procedure  tray: "Block Tray" (Disposable  single use) Skin infiltration needle: Regular 1.5-in, 25-G, (x1) Block Needle type: Spinal Amount/quantity: 1 Size: Long (7-inch) Gauge: 22G    Standing Status:   Standing    Number of Occurrences:   1    Order Specific Question:   Specify    Answer:   Block Tray   Chronic Opioid Analgesic:  None MME/day: 0 mg/day   Medications ordered for procedure: Meds ordered this encounter  Medications   iohexol (OMNIPAQUE) 180 MG/ML injection 10 mL    Must be Myelogram-compatible. If not available, you may substitute with a water-soluble, non-ionic, hypoallergenic, myelogram-compatible radiological contrast medium.   lidocaine (XYLOCAINE) 2 % (with pres) injection 400 mg   pentafluoroprop-tetrafluoroeth (GEBAUERS) aerosol   ropivacaine (PF) 2 mg/mL (0.2%) (NAROPIN) injection 9 mL   methylPREDNISolone acetate (DEPO-MEDROL) injection 80 mg   Medications administered: We administered iohexol, lidocaine, pentafluoroprop-tetrafluoroeth, ropivacaine (PF) 2 mg/mL (0.2%), and methylPREDNISolone acetate.  See the medical record for exact dosing, route, and time of administration.  Follow-up plan:   Return in about 2 weeks (around 04/28/2022) for Proc-day (T,Th), (Face2F), (PPE).       Interventional Therapies  Risk Factors  Considerations:   WNL   Planned  Pending:   Diagnostic right IA hip inj. targeting the gluteal tendon and trochanteric bursa #2    Under consideration:   Diagnostic lumbar facet medial branch MBB #1  Diagnostic bilateral shoulder joint inj. #1  Diagnostic bilateral suprascapular NB #1  Diagnostic cervical ESI #1  Diagnostic cervical facet MBB #1  Diagnostic right knee inj. #1  Diagnostic right genicular NB #1  Completed:   Diagnostic right IA hip inj. x1 (03/25/2021) (100/100/30/30)  Diagnostic right trochanteric bursa inj. x1 (03/25/2021) (100/100/30/30)    Therapeutic  Palliative (PRN) options:   None established       Recent Visits No visits were found meeting these conditions. Showing recent visits within past 90 days and meeting all other requirements Today's Visits Date Type Provider Dept  04/14/22 Procedure visit Milinda Pointer, MD [facility]  Showing today's visits and meeting all other requirements Future Appointments Date Type Provider Dept  04/30/22 Appointment Milinda Pointer, MD [facility]  Showing future appointments within next 90 days and meeting all other requirements  Disposition: Discharge home  Discharge (Date  Time): 04/14/2022; 1258 hrs.   Primary Care Physician: Jenne Pane (Inactive) Location: [facility] Note by: Gaspar Cola, MD Date: 04/14/2022; Time: 1:27 PM  Disclaimer:  Medicine is not an Chief Strategy Officer. The only guarantee in medicine is that nothing is guaranteed. It is important to note that the decision to proceed with this intervention was based on the information collected from the patient. The Data and conclusions were drawn from the patient's questionnaire, the interview, and the physical examination. Because the information was provided in large part by the patient, it cannot be guaranteed that it has not been purposely or unconsciously manipulated. Every effort has been made to obtain as much relevant data as possible for this evaluation. It is important to note that the conclusions that lead to this procedure are derived in large part from the available data. Always take into account that the treatment will also be dependent on availability of resources and existing treatment guidelines, considered by other Pain Management Practitioners as being common knowledge and practice, at the time of the intervention. For Medico-Legal purposes, it is also important to point out that variation in procedural techniques and pharmacological choices are the acceptable norm. The indications, contraindications, technique,  and results of the above procedure should only be interpreted and judged by a Board-Certified Interventional Pain Specialist with extensive familiarity and expertise in the same exact procedure and technique.

## 2022-04-13 ENCOUNTER — Ambulatory Visit (INDEPENDENT_AMBULATORY_CARE_PROVIDER_SITE_OTHER): Payer: No Typology Code available for payment source

## 2022-04-13 DIAGNOSIS — J309 Allergic rhinitis, unspecified: Secondary | ICD-10-CM

## 2022-04-14 ENCOUNTER — Ambulatory Visit
Admission: RE | Admit: 2022-04-14 | Discharge: 2022-04-14 | Disposition: A | Discharge status: Home / Self Care | Payer: No Typology Code available for payment source | Source: Ambulatory Visit | Attending: Pain Medicine | Admitting: Pain Medicine

## 2022-04-14 ENCOUNTER — Encounter: Payer: Self-pay | Admitting: Pain Medicine

## 2022-04-14 ENCOUNTER — Ambulatory Visit: Payer: No Typology Code available for payment source | Attending: Pain Medicine | Admitting: Pain Medicine

## 2022-04-14 VITALS — BP 120/65 | HR 56 | Temp 98.1°F | Resp 17 | Ht 70.5 in | Wt 209.0 lb

## 2022-04-14 DIAGNOSIS — G8929 Other chronic pain: Secondary | ICD-10-CM | POA: Insufficient documentation

## 2022-04-14 DIAGNOSIS — R1031 Right lower quadrant pain: Secondary | ICD-10-CM | POA: Diagnosis present

## 2022-04-14 DIAGNOSIS — M25852 Other specified joint disorders, left hip: Secondary | ICD-10-CM | POA: Diagnosis present

## 2022-04-14 DIAGNOSIS — M7061 Trochanteric bursitis, right hip: Secondary | ICD-10-CM | POA: Insufficient documentation

## 2022-04-14 DIAGNOSIS — M25551 Pain in right hip: Secondary | ICD-10-CM | POA: Insufficient documentation

## 2022-04-14 DIAGNOSIS — M25851 Other specified joint disorders, right hip: Secondary | ICD-10-CM | POA: Insufficient documentation

## 2022-04-14 DIAGNOSIS — M1611 Unilateral primary osteoarthritis, right hip: Secondary | ICD-10-CM | POA: Insufficient documentation

## 2022-04-14 MED ORDER — LIDOCAINE HCL 2 % IJ SOLN
INTRAMUSCULAR | Status: AC
  Filled 2022-04-14: qty 20

## 2022-04-14 MED ORDER — PENTAFLUOROPROP-TETRAFLUOROETH EX AERO
INHALATION_SPRAY | Freq: Once | CUTANEOUS | Status: AC
  Administered 2022-04-14: 30 via TOPICAL

## 2022-04-14 MED ORDER — METHYLPREDNISOLONE ACETATE 80 MG/ML IJ SUSP
80.0000 mg | Freq: Once | INTRAMUSCULAR | Status: AC
  Administered 2022-04-14: 80 mg via INTRA_ARTICULAR

## 2022-04-14 MED ORDER — ROPIVACAINE HCL 2 MG/ML IJ SOLN
9.0000 mL | Freq: Once | INTRAMUSCULAR | Status: AC
  Administered 2022-04-14: 9 mL via INTRA_ARTICULAR

## 2022-04-14 MED ORDER — LIDOCAINE HCL 2 % IJ SOLN
20.0000 mL | Freq: Once | INTRAMUSCULAR | Status: AC
  Administered 2022-04-14: 400 mg

## 2022-04-14 MED ORDER — IOHEXOL 180 MG/ML  SOLN
10.0000 mL | Freq: Once | INTRAMUSCULAR | Status: AC
  Administered 2022-04-14: 10 mL via INTRA_ARTICULAR
  Filled 2022-04-14: qty 20

## 2022-04-14 MED ORDER — METHYLPREDNISOLONE ACETATE 80 MG/ML IJ SUSP
INTRAMUSCULAR | Status: AC
  Filled 2022-04-14: qty 1

## 2022-04-14 MED ORDER — ROPIVACAINE HCL 2 MG/ML IJ SOLN
INTRAMUSCULAR | Status: AC
  Filled 2022-04-14: qty 20

## 2022-04-14 NOTE — Patient Instructions (Signed)

## 2022-04-14 NOTE — Progress Notes (Signed)
Safety precautions to be maintained throughout the outpatient stay will include: orient to surroundings, keep bed in low position, maintain call bell within reach at all times, provide assistance with transfer out of bed and ambulation.  

## 2022-04-15 ENCOUNTER — Telehealth: Payer: Self-pay

## 2022-04-15 NOTE — Telephone Encounter (Signed)
Post procedure follow up.  Patient states he is doing great.

## 2022-04-21 ENCOUNTER — Ambulatory Visit (INDEPENDENT_AMBULATORY_CARE_PROVIDER_SITE_OTHER): Payer: No Typology Code available for payment source

## 2022-04-21 DIAGNOSIS — J309 Allergic rhinitis, unspecified: Secondary | ICD-10-CM

## 2022-04-28 ENCOUNTER — Ambulatory Visit (INDEPENDENT_AMBULATORY_CARE_PROVIDER_SITE_OTHER): Payer: No Typology Code available for payment source

## 2022-04-28 DIAGNOSIS — J309 Allergic rhinitis, unspecified: Secondary | ICD-10-CM | POA: Diagnosis not present

## 2022-04-29 NOTE — Progress Notes (Unsigned)
PROVIDER NOTE: Information contained herein reflects review and annotations entered in association with encounter. Interpretation of such information and data should be left to medically-trained personnel. Information provided to patient can be located elsewhere in the medical record under "Patient Instructions". Document created using STT-dictation technology, any transcriptional errors that may result from process are unintentional.    Patient: [name]  Service Category: E/M  Provider: Gaspar Cola, MD  DOB: 09-24-1960  DOS: 04/30/2022  Referring Provider: No ref. provider found  MRN: [id number]  Specialty: Interventional Pain Management  PCP: Jenne Pane (Inactive)  Type: Established Patient  Setting: Ambulatory outpatient    Location: Office  Delivery: Face-to-face     HPI  [name], a 62 y.o. year old male, is here today because of his Chronic hip pain, right [M25.551, G89.29]. [name] primary complain today is No chief complaint on file. Last encounter: My last encounter with him was on 04/14/2022. Pertinent problems: [name] has Gout; Chronic shoulder pain (Right); Chronic low back pain (2ry area of Pain) (Midline) w/o sciatica; Postlaminectomy syndrome, cervical region; Chronic pain syndrome; Chronic knee pain (5th area of Pain) (Right); Chronic hip pain (1ry area of Pain) (Right); Enthesopathy of hip region (Right); Chronic groin pain (Right); Chronic low back pain (Right) w/o sciatica; Lumbar facet syndrome (Bilateral) (R>L); Chronic shoulder pain (3ry area of Pain) (Bilateral) (R>L); Chronic neck pain with history of cervical spinal surgery (4th area of Pain); Trochanteric bursitis of hip (Right); Primary osteoarthritis of right knee; Carpal tunnel syndrome; Cervical radiculopathy; Degenerative joint disease of shoulder (Right); Impingement syndrome of shoulder region; Lesion of ulnar nerve, right upper limb; Sprain of sacroiliac joint; Osteoarthritis of hip (Right); Pain  in right hip; Pain in left shoulder; Pain in right elbow; Gluteal tendinopathy (Right); DDD (degenerative disc disease), lumbosacral; Lumbar facet hypertrophy (Multilevel) (Bilateral); Lumbar foraminal 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 Pain Assessment: Severity of   is reported as a  /10. Location:    / . Onset:  . Quality:  . Timing:  . Modifying factor(s):  Marland Kitchen Vitals:  vitals were not taken for this visit.  BMI: Estimated body mass index is 29.56 kg/m as calculated from the following:   Height as of 04/14/22: 5' 10.5" (1.791 m).   Weight as of 04/14/22: 209 lb (94.8 kg).  Reason for encounter: post-procedure evaluation and assessment. ***  Post-procedure evaluation   Procedure: Hip & bursae injection #2  Laterality: Right (-RT)  Bursae: Trochanteric  Laterality: Right (-RT)  Approach: Percutaneous posterolateral approach. Level: Lower pelvic and hip joint level.  Imaging: Fluoroscopy-guided         Anesthesia: Local anesthesia (1-2% Lidocaine) Anxiolysis: None                 Sedation: No Sedation                       DOS: 04/14/2022  Performed by: Gaspar Cola, MD  Purpose: Diagnostic/Therapeutic Indications: Hip pain severe enough to impact quality of life or function. Rationale (medical necessity): procedure needed and proper for the diagnosis and/or treatment of [name] medical symptoms and needs. 1. Chronic hip pain (1ry area of Pain) (Right)   2. Chronic groin pain (Right)   3. Osteoarthritis of hip (Right)   4. Trochanteric bursitis of hip (Right)    NAS-11 Pain score:   Pre-procedure: 6 /10  Post-procedure: 3  (moving)/10       Effectiveness:  Initial hour after procedure:   ***. Subsequent 4-6 hours post-procedure:   ***. Analgesia past initial 6 hours:   ***. Ongoing improvement:  Analgesic:  *** Function:    ***    ROM:    ***      Pharmacotherapy  Assessment  Analgesic: None MME/day: 0 mg/day   Monitoring: [place] PMP: PDMP reviewed during this encounter.       Pharmacotherapy: No side-effects or adverse reactions reported. Compliance: No problems identified. Effectiveness: Clinically acceptable.  No notes on file  No results found for: "CBDTHCR" No results found for: "D8THCCBX" No results found for: "D9THCCBX"  UDS:  Summary  Date Value Ref Range Status  03/17/2021 Note  Final    Comment:    ==================================================================== Compliance Drug Analysis, Ur ==================================================================== Test                             Result       Flag       Units  Drug Present and Declared for Prescription Verification   Pregabalin                     PRESENT      EXPECTED  Drug Present not Declared for Prescription Verification   Acetaminophen                  PRESENT      UNEXPECTED ==================================================================== Test                      Result    Flag   Units      Ref Range   Creatinine              127              mg/dL      >=20 ==================================================================== Declared Medications:  The flagging and interpretation on this report are based on the  following declared medications.  Unexpected results may arise from  inaccuracies in the declared medications.   **Note: The testing scope of this panel includes these medications:   Pregabalin (Lyrica)   **Note: The testing scope of this panel does not include the  following reported medications:   Calcium  Cetirizine (Zyrtec)  Clindamycin (Cleocin)  Fish Oil  Ipratropium (Atrovent)  Ketotifen (Zaditor)  Montelukast (Singulair)  Sildenafil (Viagra)  Vitamin B  Vitamin D3 ==================================================================== For clinical consultation, please call [phone]. ====================================================================       ROS  Constitutional: Denies any fever or chills Gastrointestinal: No reported hemesis, hematochezia, vomiting, or acute GI distress Musculoskeletal: Denies any acute onset joint swelling, redness, loss of ROM, or weakness Neurological: No reported episodes of acute onset apraxia, aphasia, dysarthria, agnosia, amnesia, paralysis, loss of coordination, or loss of consciousness  Medication Review  Fish Oil, b complex vitamins, calcium citrate, chlorpheniramine, cholecalciferol, clindamycin, ipratropium, ketotifen, montelukast, pregabalin, and sildenafil  History Review  Allergy: Mr. Nelli has No Known Allergies. Drug: Mr. Mangels  reports no history of drug use. Alcohol:  reports no history of alcohol use. Tobacco:  reports that he has never smoked. He does not have any smokeless tobacco history on file. Social: Mr. Willinger  reports that he has never smoked. He does not have any smokeless tobacco history on file. He reports that he does not drink alcohol and does not use drugs. Medical:  has a past medical history of Allergy, Arthritis, Chronic neck pain, Gout, History of kidney stones, Hyperlipidemia, Joint pain, and Sleep apnea. Surgical: Mr. Vicini  has a past surgical history that includes Shoulder surgery (72yr ago and then 2013); Carpal tunnel release (2008); Cervical fusion (2003); Knee arthroscopy (15+yrs ago); Vasectomy (89yr[age]); Shoulder surgery ([age]yrgo); colonosocpy; Circumcision ([age]yrand Anterior cervical decomp/discectomy fusion (11/19/2011). Family: family history includes Cancer in his mother and sister.  Laboratory Chemistry Profile   Renal Lab Results  Component Value Date   BUN 8 03/17/2021   CREATININE 0.99 03/17/2021   BCR 8 (L) 03/17/2021   GFRAA 84 08/24/2013   GFRNONAA 72 08/24/2013    Hepatic Lab Results  Component Value Date   AST 32 03/17/2021   ALT 16 08/24/2013    ALBUMIN 4.3 03/17/2021   ALKPHOS 70 03/17/2021    Electrolytes Lab Results  Component Value Date   NA 142 03/17/2021   K 4.5 03/17/2021   CL 103 03/17/2021   CALCIUM 9.9 03/17/2021   MG 2.2 03/17/2021    Bone Lab Results  Component Value Date   VD25OH 29 (L) 07/14/2012   25OHVITD1 39 03/17/2021   25OHVITD2 <1.0 03/17/2021   25OHVITD3 39 03/17/2021   TESTOFREE 87.2 07/14/2012   TESTOSTERONE 352 07/14/2012    Inflammation (CRP: Acute Phase) (ESR: Chronic Phase) Lab Results  Component Value Date   CRP <1 03/17/2021   ESRSEDRATE 9 03/17/2021         Note: Above Lab results reviewed.  Recent Imaging Review  DG PAIN CLINIC C-ARM 1-60 MIN NO REPORT Fluoro was used, but no Radiologist interpretation will be provided.  Please refer to "NOTES" tab for provider progress note. Note: Reviewed        Physical Exam  General appearance: Well nourished, well developed, and well hydrated. In no apparent acute distress Mental status: Alert, oriented x 3 (person, place, & time)       Respiratory: No evidence of acute respiratory distress Eyes: PERLA Vitals: There were no vitals taken for this visit. BMI: Estimated body mass index is 29.56 kg/m as calculated from the following:   Height as of 04/14/22: 5' 10.5" (1.791 m).   Weight as of 04/14/22: 209 lb (94.8 kg). Ideal: Patient weight not recorded  Assessment   Diagnosis Status  1. Chronic hip pain (1ry area of Pain) (Right)   2. Chronic groin pain (Right)   3. Trochanteric bursitis of hip (Right)   4. Pain in right hip   5. Chronic low back pain (2ry area of Pain) (Midline) w/o sciatica    Controlled Controlled Controlled   Updated Problems: No problems updated.  Plan of Care  Problem-specific:  No problem-specific Assessment & Plan notes found for this encounter.  Mr. ToddGRANTLEY STAN a current medication list which includes the following long-term medication(s): calcium citrate, chlorpheniramine, ipratropium,  montelukast, pregabalin, and sildenafil.  Pharmacotherapy (Medications Ordered): No orders of the defined types were placed in this encounter.  Orders:  No orders of the defined types were placed in this encounter.  Follow-up plan:   No follow-ups on file.      Interventional Therapies  Risk Factors  Considerations:   WNL   Planned  Pending:   Diagnostic right IA hip inj. targeting the gluteal tendon and trochanteric bursa #2    Under consideration:   Diagnostic lumbar facet medial branch MBB #1  Diagnostic bilateral shoulder joint inj. #1  Diagnostic bilateral suprascapular NB #1  Diagnostic cervical ESI #1  Diagnostic cervical facet MBB #1  Diagnostic right knee inj. #1  Diagnostic right genicular NB #1    Completed:   Diagnostic right IA hip inj. x1 (03/25/2021) (100/100/30/30)  Diagnostic right trochanteric bursa inj. x1 (03/25/2021) (100/100/30/30)    Therapeutic  Palliative (PRN) options:   None established       Recent Visits Date Type Provider Dept  04/14/22 Procedure visit Milinda Pointer, MD [facility]  Showing recent visits within past 90 days and meeting all other requirements Future Appointments Date Type Provider Dept  04/30/22 Appointment Milinda Pointer, MD [facility]  Showing future appointments within next 90 days and meeting all other requirements  I discussed the assessment and treatment plan with the patient. The patient was provided an opportunity to ask questions and all were answered. The patient agreed with the plan and demonstrated an understanding of the instructions.  Patient advised to call back or seek an in-person evaluation if the symptoms or condition worsens.  Duration of encounter: *** minutes.  Total time on encounter, as per AMA guidelines included both the face-to-face and non-face-to-face time personally spent by the physician and/or other qualified health care professional(s) on the day of the  encounter (includes time in activities that require the physician or other qualified health care professional and does not include time in activities normally performed by clinical staff). Physician's time may include the following activities when performed: Preparing to see the patient (e.g., pre-charting review of records, searching for previously ordered imaging, lab work, and nerve conduction tests) Review of prior analgesic pharmacotherapies. Reviewing PMP Interpreting ordered tests (e.g., lab work, imaging, nerve conduction tests) Performing post-procedure evaluations, including interpretation of diagnostic procedures Obtaining and/or reviewing separately obtained history Performing a medically appropriate examination and/or evaluation Counseling and educating the patient/family/caregiver Ordering medications, tests, or procedures Referring and communicating with other health care professionals (when not separately reported) Documenting clinical information in the electronic or other health record Independently interpreting results (not separately reported) and communicating results to the patient/ family/caregiver Care coordination (not separately reported)  Note by: Gaspar Cola, MD Date: 04/30/2022; Time: 7:53 AM

## 2022-04-30 ENCOUNTER — Encounter: Payer: Self-pay | Admitting: Pain Medicine

## 2022-04-30 ENCOUNTER — Ambulatory Visit: Payer: No Typology Code available for payment source | Attending: Pain Medicine | Admitting: Pain Medicine

## 2022-04-30 VITALS — BP 110/78 | HR 77 | Temp 98.1°F | Ht 71.0 in | Wt 209.0 lb

## 2022-04-30 DIAGNOSIS — M48061 Spinal stenosis, lumbar region without neurogenic claudication: Secondary | ICD-10-CM | POA: Diagnosis present

## 2022-04-30 DIAGNOSIS — M5416 Radiculopathy, lumbar region: Secondary | ICD-10-CM | POA: Diagnosis present

## 2022-04-30 DIAGNOSIS — M545 Low back pain, unspecified: Secondary | ICD-10-CM | POA: Diagnosis present

## 2022-04-30 DIAGNOSIS — M25561 Pain in right knee: Secondary | ICD-10-CM | POA: Insufficient documentation

## 2022-04-30 DIAGNOSIS — G8929 Other chronic pain: Secondary | ICD-10-CM | POA: Diagnosis present

## 2022-04-30 DIAGNOSIS — M25551 Pain in right hip: Secondary | ICD-10-CM | POA: Insufficient documentation

## 2022-04-30 DIAGNOSIS — R1031 Right lower quadrant pain: Secondary | ICD-10-CM | POA: Insufficient documentation

## 2022-04-30 DIAGNOSIS — M7061 Trochanteric bursitis, right hip: Secondary | ICD-10-CM | POA: Insufficient documentation

## 2022-04-30 NOTE — Progress Notes (Signed)
Safety precautions to be maintained throughout the outpatient stay will include: orient to surroundings, keep bed in low position, maintain call bell within reach at all times, provide assistance with transfer out of bed and ambulation.  

## 2022-05-04 ENCOUNTER — Encounter: Payer: Self-pay | Admitting: Pain Medicine

## 2022-05-04 DIAGNOSIS — H524 Presbyopia: Secondary | ICD-10-CM | POA: Insufficient documentation

## 2022-05-04 DIAGNOSIS — J309 Allergic rhinitis, unspecified: Secondary | ICD-10-CM | POA: Insufficient documentation

## 2022-05-04 DIAGNOSIS — R937 Abnormal findings on diagnostic imaging of other parts of musculoskeletal system: Secondary | ICD-10-CM | POA: Insufficient documentation

## 2022-05-04 DIAGNOSIS — R935 Abnormal findings on diagnostic imaging of other abdominal regions, including retroperitoneum: Secondary | ICD-10-CM | POA: Insufficient documentation

## 2022-05-04 DIAGNOSIS — Z0289 Encounter for other administrative examinations: Secondary | ICD-10-CM | POA: Insufficient documentation

## 2022-05-04 DIAGNOSIS — R202 Paresthesia of skin: Secondary | ICD-10-CM | POA: Insufficient documentation

## 2022-05-04 DIAGNOSIS — M533 Sacrococcygeal disorders, not elsewhere classified: Secondary | ICD-10-CM | POA: Insufficient documentation

## 2022-05-04 DIAGNOSIS — M1611 Unilateral primary osteoarthritis, right hip: Secondary | ICD-10-CM | POA: Insufficient documentation

## 2022-05-07 ENCOUNTER — Other Ambulatory Visit: Payer: Self-pay

## 2022-05-07 ENCOUNTER — Ambulatory Visit (INDEPENDENT_AMBULATORY_CARE_PROVIDER_SITE_OTHER): Payer: No Typology Code available for payment source

## 2022-05-07 ENCOUNTER — Encounter: Payer: Self-pay | Admitting: Allergy & Immunology

## 2022-05-07 ENCOUNTER — Ambulatory Visit (INDEPENDENT_AMBULATORY_CARE_PROVIDER_SITE_OTHER): Payer: No Typology Code available for payment source | Admitting: Allergy & Immunology

## 2022-05-07 VITALS — BP 112/80 | HR 76 | Temp 98.4°F | Resp 20

## 2022-05-07 DIAGNOSIS — J3089 Other allergic rhinitis: Secondary | ICD-10-CM | POA: Diagnosis not present

## 2022-05-07 DIAGNOSIS — J302 Other seasonal allergic rhinitis: Secondary | ICD-10-CM

## 2022-05-07 DIAGNOSIS — J309 Allergic rhinitis, unspecified: Secondary | ICD-10-CM | POA: Diagnosis not present

## 2022-05-07 MED ORDER — MONTELUKAST SODIUM 10 MG PO TABS: 10.0000 mg | ORAL_TABLET | Freq: Every day | ORAL | 2 refills | Status: DC

## 2022-05-07 MED ORDER — MONTELUKAST SODIUM 10 MG PO TABS: 10.0000 mg | ORAL_TABLET | Freq: Every day | ORAL | 5 refills | Status: DC

## 2022-05-07 MED ORDER — CHLORPHENIRAMINE MALEATE 4 MG PO TABS: 4.0000 mg | ORAL_TABLET | Freq: Once | ORAL | 2 refills | Status: DC

## 2022-05-07 NOTE — Patient Instructions (Addendum)
1. Seasonal and perennial allergic rhinitis - Previous testing was positive to: weeds, trees, indoor molds, dust mites, and cockroach. - Avoidance measures provided. - Continue with: Singulair (montelukast) '10mg'$  daily and chlorpheniramine 4 mg at night. - Continue with allergy shots at the same schedule.  2. Return in about 6 months (around 11/05/2022).    Please inform us of any Emergency Department visits, hospitalizations, or changes in symptoms. Call us before going to the ED for breathing or allergy symptoms since we might be able to fit you in for a sick visit. Feel free to contact us anytime with any questions, problems, or concerns.  It was a pleasure to see you today!  Websites that have reliable patient information: 1. American Academy of Asthma, Allergy, and Immunology: www.aaaai.org 2. Food Allergy Research and Education (FARE): foodallergy.org 3. Mothers of Asthmatics: http://www.asthmacommunitynetwork.org 4. American College of Allergy, Asthma, and Immunology: www.acaai.org   COVID-19 Vaccine Information can be found at: ShippingScam.co.uk For questions related to vaccine distribution or appointments, please email [email or web address] or call [phone].   We realize that you might be concerned about having an allergic reaction to the COVID19 vaccines. To help with that concern, WE ARE OFFERING THE COVID19 VACCINES IN OUR OFFICE! Ask the front desk for dates!     "Like" [place] on Facebook and Instagram for our latest updates!      A healthy democracy works best when [employer] participate! Make sure you are registered to vote! If you have moved or changed any of your contact information, you will need to get this updated before voting!  In some cases, you MAY be able to register to vote online: [email or web address]

## 2022-05-07 NOTE — Progress Notes (Signed)
FOLLOW UP  Date of Service/Encounter:  05/07/22   Assessment:   Seasonal and perennial allergic rhinitis (weeds, trees, indoor molds, dust mites, and cockroach) - on allergen immunotherapy with maintenance reached February 2024  Rosacea   Retired veteran  Plan/Recommendations:    1. Seasonal and perennial allergic rhinitis - Previous testing was positive to: weeds, trees, indoor molds, dust mites, and cockroach. - Avoidance measures provided. - Continue with: Singulair (montelukast) '10mg'$  daily and chlorpheniramine 4 mg at night. - Continue with allergy shots at the same schedule.  2. Return in about 6 months (around 11/05/2022).     Subjective:   [name] is a 62 y.o. male presenting today for follow up of  Chief Complaint  Patient presents with   Follow-up    [name] has a history of the following: Patient Active Problem List   Diagnosis Date Noted   Allergic rhinitis, unspecified 05/04/2022   Paresthesia of skin 05/04/2022   Presbyopia 05/04/2022   Encounter for other administrative examinations 05/04/2022   Sacrococcygeal disorders, not elsewhere classified 05/04/2022   Unilateral primary osteoarthritis of hip (Right) 05/04/2022   Abnormal MRI, hip (Right) (02/14/2020) ([place]) 05/04/2022   Abnormal MRI, lumbar spine (02/15/2020) ([place]) 05/04/2022   Lumbar radiculitis (Bilateral) 04/30/2022   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   Trochanteric bursitis of hip (Right) 03/18/2021   Complex renal cyst 03/18/2021   Impotence 03/18/2021   Primary osteoarthritis of knee (Right) 03/18/2021   Carpal tunnel syndrome 03/18/2021   Cervical radiculopathy 03/18/2021   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   Encounter for surgical aftercare following surgery on the nervous system 03/18/2021   Enlarged prostate 03/18/2021   Gross hematuria 03/18/2021   Impingement syndrome of shoulder region 03/18/2021    Kidney stone 03/18/2021   Lesion of ulnar nerve (Right) 03/18/2021   Male erectile dysfunction, unspecified 03/18/2021   Primary erectile dysfunction 03/18/2021   Obesity 03/18/2021   Obstructive sleep apnea (adult) (pediatric) 03/18/2021   Obstructive sleep apnea of adult 03/18/2021   Sprain of sacroiliac joint 03/18/2021   Osteoarthritis of hip (Right) 03/18/2021   Unspecified disorder of refraction 03/18/2021   Vitamin D deficiency 03/18/2021   Pain in hip (Right) 03/18/2021   Encounter for other preprocedural examination 03/18/2021   Encounter for immunization 03/18/2021   Counseling, unspecified 03/18/2021   Pain in shoulder (Left) 03/18/2021   Pain in elbow (Right) 03/18/2021   Gluteal tendinopathy (Right) 03/18/2021   DDD (degenerative disc disease), lumbosacral 03/18/2021   Lumbar facet hypertrophy (Multilevel) (Bilateral) 03/18/2021   Lumbar foraminal stenosis (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, L4-5) (Left: L5-S1) 03/18/2021   Chronic hip pain (1ry area of Pain) (Right) 03/17/2021   Enthesopathy of hip region (Right) 03/17/2021   Chronic groin pain (Right) 03/17/2021   Chronic low back pain (Right) w/o sciatica 03/17/2021   Lumbar facet syndrome (Bilateral) (R>L) 03/17/2021   Chronic shoulder pain (3ry area of Pain) (Bilateral) (R>L) 03/17/2021   Chronic neck pain with history of cervical spinal surgery (4th area of Pain) 03/17/2021   Chronic pain syndrome 03/13/2021   Pharmacologic therapy 03/13/2021   Disorder of skeletal system 03/13/2021   Problems influencing health status 03/13/2021   Chronic knee pain (5th area of Pain) (Right) 11/25/2017   Postlaminectomy syndrome, cervical region 04/12/2013   Chronic low back pain (2ry area of Pain) (Midline) w/o sciatica 02/06/2013   Chronic shoulder pain (Right) 12/30/2012   Hyperlipidemia 10/11/2011   Gout 10/11/2011   Acne  10/11/2011    History obtained from: chart review and patient.  [name] is a 61 y.o. male presenting for a follow up  visit.  He was last seen in August 2023.  At that time, we continue with Singulair as well as Atrovent.  We also continue with Chlortab 4 mg every 8 hours.  He did decide to start allergen immunotherapy.  Since the last visit, he has done well. He is tolerating the shots without a problem. He generally feels good anyway, but he feels that he is having less sneezing and whatnot. He is not using the nose spray regularly using the montelukast. He has not been on antibiotics at all since the last visit. He thinks that everyone gives good shots in the office.   [name] is on allergen immunotherapy. He receives two injections. Immunotherapy script #1 contains trees and weeds. He currently receives 0.53m of the RED vial (1/100). Immunotherapy script #2 contains molds and cockroach. He currently receives 0.273mof the RED vial (1/100). He started shots September of 2023 and reached maintenance in February of 2024.  He has not had any large local reactions.  Otherwise, there have been no changes to his past medical history, surgical history, family history, or social history.    Review of Systems  Constitutional: Negative.  Negative for chills, fever, malaise/fatigue and weight loss.  HENT:  Positive for congestion. Negative for ear discharge, ear pain and sinus pain.   Eyes:  Negative for pain, discharge and redness.  Respiratory:  Negative for cough, sputum production, shortness of breath and wheezing.   Cardiovascular: Negative.  Negative for chest pain and palpitations.  Gastrointestinal:  Negative for abdominal pain, constipation, diarrhea, heartburn, nausea and vomiting.  Skin: Negative.  Negative for itching and rash.  Neurological:  Negative for dizziness and headaches.  Endo/Heme/Allergies:  Positive for environmental allergies. Does not bruise/bleed easily.       Objective:   Blood pressure 112/80, pulse 76, temperature 98.4 F (36.9 C), temperature source Temporal, resp. rate 20, SpO2 98  %. There is no height or weight on file to calculate BMI.    Physical Exam Vitals reviewed.  Constitutional:      Appearance: He is well-developed.     Comments: Very pleasant.  Talkative.  HENT:     Head: Normocephalic and atraumatic.     Right Ear: Tympanic membrane, ear canal and external ear normal. No drainage, swelling or tenderness. Tympanic membrane is not injected, scarred, erythematous, retracted or bulging.     Left Ear: Tympanic membrane, ear canal and external ear normal. No drainage, swelling or tenderness. Tympanic membrane is not injected, scarred, erythematous, retracted or bulging.     Nose: No nasal deformity, septal deviation, mucosal edema or rhinorrhea.     Right Turbinates: Enlarged, swollen and pale.     Left Turbinates: Enlarged, swollen and pale.     Right Sinus: No maxillary sinus tenderness or frontal sinus tenderness.     Left Sinus: No maxillary sinus tenderness or frontal sinus tenderness.     Comments: No nasal polyps.     Mouth/Throat:     Mouth: Mucous membranes are not pale and not dry.     Pharynx: Uvula midline.  Eyes:     General: Lids are normal.        Right eye: No discharge.        Left eye: No discharge.     Conjunctiva/sclera: Conjunctivae normal.     Right eye: Right conjunctiva is  not injected. No chemosis.    Left eye: Left conjunctiva is not injected. No chemosis.    Pupils: Pupils are equal, round, and reactive to light.  Cardiovascular:     Rate and Rhythm: Normal rate and regular rhythm.     Heart sounds: Normal heart sounds.  Pulmonary:     Effort: Pulmonary effort is normal. No tachypnea, accessory muscle usage or respiratory distress.     Breath sounds: Normal breath sounds. No wheezing, rhonchi or rales.     Comments: Moving air well in all lung fields. No increased work of breathing noted.  Chest:     Chest wall: No tenderness.  Lymphadenopathy:     Head:     Right side of head: No submandibular, tonsillar or occipital  adenopathy.     Left side of head: No submandibular, tonsillar or occipital adenopathy.     Cervical: No cervical adenopathy.  Skin:    General: Skin is warm.     Capillary Refill: Capillary refill takes less than 2 seconds.     Coloration: Skin is not pale.     Findings: No abrasion, erythema, petechiae or rash. Rash is not papular, urticarial or vesicular.     Comments: No wheezing or crackles noted.   Neurological:     Mental Status: He is alert.  Psychiatric:        Behavior: Behavior is cooperative.      Diagnostic studies: none       Salvatore Marvel, MD  [facility] [place]

## 2022-05-08 ENCOUNTER — Encounter: Payer: Self-pay | Admitting: Allergy & Immunology

## 2022-05-08 MED ORDER — EPINEPHRINE 0.3 MG/0.3ML IJ SOAJ: 0.3000 mg | INTRAMUSCULAR | 1 refills | Status: AC | PRN

## 2022-05-11 ENCOUNTER — Ambulatory Visit (INDEPENDENT_AMBULATORY_CARE_PROVIDER_SITE_OTHER): Payer: No Typology Code available for payment source | Admitting: *Deleted

## 2022-05-11 DIAGNOSIS — J309 Allergic rhinitis, unspecified: Secondary | ICD-10-CM | POA: Diagnosis not present

## 2022-05-12 ENCOUNTER — Other Ambulatory Visit: Payer: No Typology Code available for payment source

## 2022-05-13 ENCOUNTER — Ambulatory Visit
Admission: RE | Admit: 2022-05-13 | Discharge: 2022-05-13 | Disposition: A | Discharge status: Home / Self Care | Payer: No Typology Code available for payment source | Source: Ambulatory Visit | Attending: Pain Medicine | Admitting: Pain Medicine

## 2022-05-13 DIAGNOSIS — M25551 Pain in right hip: Secondary | ICD-10-CM | POA: Insufficient documentation

## 2022-05-13 DIAGNOSIS — G8929 Other chronic pain: Secondary | ICD-10-CM | POA: Diagnosis present

## 2022-05-13 DIAGNOSIS — M545 Low back pain, unspecified: Secondary | ICD-10-CM | POA: Diagnosis present

## 2022-05-13 DIAGNOSIS — M48061 Spinal stenosis, lumbar region without neurogenic claudication: Secondary | ICD-10-CM

## 2022-05-14 ENCOUNTER — Ambulatory Visit
Admission: RE | Admit: 2022-05-14 | Discharge: 2022-05-14 | Disposition: A | Discharge status: Home / Self Care | Payer: No Typology Code available for payment source | Source: Ambulatory Visit | Attending: Pain Medicine | Admitting: Pain Medicine

## 2022-05-14 ENCOUNTER — Ambulatory Visit: Payer: No Typology Code available for payment source | Attending: Pain Medicine | Admitting: Pain Medicine

## 2022-05-14 ENCOUNTER — Encounter: Payer: Self-pay | Admitting: Pain Medicine

## 2022-05-14 VITALS — BP 128/92 | HR 65 | Temp 98.6°F | Resp 16 | Ht 70.0 in | Wt 205.0 lb

## 2022-05-14 DIAGNOSIS — M5137 Other intervertebral disc degeneration, lumbosacral region: Secondary | ICD-10-CM | POA: Diagnosis present

## 2022-05-14 DIAGNOSIS — M5416 Radiculopathy, lumbar region: Secondary | ICD-10-CM | POA: Insufficient documentation

## 2022-05-14 DIAGNOSIS — M48061 Spinal stenosis, lumbar region without neurogenic claudication: Secondary | ICD-10-CM | POA: Diagnosis present

## 2022-05-14 DIAGNOSIS — M25551 Pain in right hip: Secondary | ICD-10-CM | POA: Insufficient documentation

## 2022-05-14 DIAGNOSIS — G8929 Other chronic pain: Secondary | ICD-10-CM | POA: Diagnosis present

## 2022-05-14 DIAGNOSIS — R937 Abnormal findings on diagnostic imaging of other parts of musculoskeletal system: Secondary | ICD-10-CM

## 2022-05-14 DIAGNOSIS — M545 Low back pain, unspecified: Secondary | ICD-10-CM | POA: Diagnosis present

## 2022-05-14 MED ORDER — PENTAFLUOROPROP-TETRAFLUOROETH EX AERO
INHALATION_SPRAY | Freq: Once | CUTANEOUS | Status: AC
  Administered 2022-05-14: 30 via TOPICAL

## 2022-05-14 MED ORDER — ROPIVACAINE HCL 2 MG/ML IJ SOLN
2.0000 mL | Freq: Once | INTRAMUSCULAR | Status: AC
  Administered 2022-05-14: 2 mL via EPIDURAL
  Filled 2022-05-14: qty 20

## 2022-05-14 MED ORDER — SODIUM CHLORIDE 0.9% FLUSH
2.0000 mL | Freq: Once | INTRAVENOUS | Status: AC
  Administered 2022-05-14: 2 mL

## 2022-05-14 MED ORDER — DEXAMETHASONE SODIUM PHOSPHATE 10 MG/ML IJ SOLN
20.0000 mg | Freq: Once | INTRAMUSCULAR | Status: AC
  Administered 2022-05-14: 20 mg
  Filled 2022-05-14: qty 2

## 2022-05-14 MED ORDER — TRIAMCINOLONE ACETONIDE 40 MG/ML IJ SUSP
40.0000 mg | Freq: Once | INTRAMUSCULAR | Status: AC
  Administered 2022-05-14: 40 mg
  Filled 2022-05-14: qty 1

## 2022-05-14 MED ORDER — LIDOCAINE HCL 2 % IJ SOLN
20.0000 mL | Freq: Once | INTRAMUSCULAR | Status: AC
  Administered 2022-05-14: 400 mg
  Filled 2022-05-14: qty 20

## 2022-05-14 MED ORDER — IOHEXOL 180 MG/ML  SOLN
10.0000 mL | Freq: Once | INTRAMUSCULAR | Status: AC
  Administered 2022-05-14: 10 mL via EPIDURAL
  Filled 2022-05-14: qty 20

## 2022-05-14 NOTE — Progress Notes (Signed)
Safety precautions to be maintained throughout the outpatient stay will include: orient to surroundings, keep bed in low position, maintain call bell within reach at all times, provide assistance with transfer out of bed and ambulation.  

## 2022-05-14 NOTE — Patient Instructions (Signed)

## 2022-05-14 NOTE — Progress Notes (Signed)
PROVIDER NOTE: Interpretation of information contained herein should be left to medically-trained personnel. Specific patient instructions are provided elsewhere under "Patient Instructions" section of medical record. This document was created in part using STT-dictation technology, any transcriptional errors that may result from this process are unintentional.  Patient: [name] Type: Established DOB: 1960-11-28 MRN: [id number] PCP: Jenne Pane (Inactive)  Service: Procedure DOS: 05/14/2022 Setting: Ambulatory Location: Ambulatory outpatient facility Delivery: Face-to-face Provider: Gaspar Cola, MD Specialty: Interventional Pain Management Specialty designation: 09 Location: Outpatient facility Ref. Prov.: No ref. provider found       Interventional Therapy   Procedure 1: Type: Lumbar Trans-Foraminal Epidural Steroid Injection (TFESI) #1  Laterality: Bilateral (-50) Paravertebral Level: L4   Target: Foraminal peri-neural region, subarticular lateral recess area, and anterior intraspinal epidural space.  Procedure 2: Type: Lumbar Epidural Steroid Injection (LESI) (Inter-laminar) #1  Laterality: Midline Paramedial Level: L3-4   Target: Posterior intraspinal epidural space Imaging: Fluoroscopic guidance Anesthesia: Local anesthesia (1-2% Lidocaine) Anxiolysis: None                 Sedation: No Sedation                       DOS: 05/14/2022  Performed by: Gaspar Cola, MD  Purpose: Diagnostic/Therapeutic Indications: Low back/lower extremity pain severe enough to impact quality of life or function. Rationale (medical necessity): procedure needed and proper for the diagnosis and/or treatment of Mr. [facility] medical symptoms and needs. 1. Lumbar radicular pain   2. 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 Lumbar radiculitis (Bilateral)   4. Chronic radicular lumbar pain   5. DDD (degenerative disc disease), lumbosacral   6. Chronic low  back pain (2ry area of Pain) (Midline) w/o sciatica   7. Chronic hip pain (1ry area of Pain) (Right)   8. Abnormal MRI, lumbar spine (02/15/2020) ([place])    NAS-11 Pain score:   Pre-procedure: 6 /10   Post-procedure: 0-No pain/10      Region: Lumbosacral Approach: Percutaneous  Type of procedure: Perineural injection   Position / Prep / Materials:  Position: Prone with head of the table was raised to facilitate breathing.  Prep solution: DuraPrep (Iodine Povacrylex [0.7% available iodine] and Isopropyl Alcohol, 74% w/w) Prep Area: Entire posterior thoracolumbar & lumbosacral region  Procedure 1 Materials:  Tray: Block Needle(s):  Type: Epidural & Spinal  Gauge (G): 22  Length: 5-in  Qty: 2    Materials:  Tray: Epidural tray Needle(s):  Type: Epidural needle          Gauge (G):  17 Length: Regular (3.5-in) Qty: 1      Pre-op H&P Assessment:  [name] is a 62 y.o. (year old), male patient, seen today for interventional treatment. He  has a past surgical history that includes Shoulder surgery (70yr ago and then 2013); Carpal tunnel release (2008); Cervical fusion (2003); Knee arthroscopy (15+yrs ago); Vasectomy (89yrago); Shoulder surgery ([age]yrgo); colonosocpy; Circumcision ([age]yrand Anterior cervical decomp/discectomy fusion (11/19/2011). [name] which includes the following prescription(s): b complex vitamins, calcium citrate, cholecalciferol, clindamycin, epinephrine, montelukast, fish oil, pregabalin, sildenafil, and chlorpheniramine. His primarily concern today is the Back Pain and Hip Pain (right)  Initial Vital Signs:  Pulse/HCG Rate: 73  Temp: 98.6 F (37 C) Resp: 16 BP: 108/82 SpO2: 99 %  BMI: Estimated body mass index is 29.41 kg/m as calculated from the following:   Height  as of this encounter: '5\' 10"'$  (1.778 m).   Weight as of this encounter: 205 lb (93 kg).  Risk Assessment: Allergies: Reviewed. He has No Known  Allergies.  Allergy Precautions: None required Coagulopathies: Reviewed. None identified.  Blood-thinner therapy: None at this time Active Infection(s): Reviewed. None identified. [name] is afebrile  Site Confirmation: [name] was asked to confirm the procedure and laterality before marking the site Procedure checklist: Completed Consent: Before the procedure and under the influence of no sedative(s), amnesic(s), or anxiolytics, the patient was informed of the treatment options, risks and possible complications. To fulfill our ethical and legal obligations, as recommended by the American Medical Association's Code of Ethics, I have informed the patient of my clinical impression; the nature and purpose of the treatment or procedure; the risks, benefits, and possible complications of the intervention; the alternatives, including doing nothing; the risk(s) and benefit(s) of the alternative treatment(s) or procedure(s); and the risk(s) and benefit(s) of doing nothing. The patient was provided information about the general risks and possible complications associated with the procedure. These may include, but are not limited to: failure to achieve desired goals, infection, bleeding, organ or nerve damage, allergic reactions, paralysis, and death. In addition, the patient was informed of those risks and complications associated to Spine-related procedures, such as failure to decrease pain; infection (i.e.: Meningitis, epidural or intraspinal abscess); bleeding (i.e.: epidural hematoma, subarachnoid hemorrhage, or any other type of intraspinal or peri-dural bleeding); organ or nerve damage (i.e.: Any type of peripheral nerve, nerve root, or spinal cord injury) with subsequent damage to sensory, motor, and/or autonomic systems, resulting in permanent pain, numbness, and/or weakness of one or several areas of the body; allergic reactions; (i.e.: anaphylactic reaction); and/or death. Furthermore, the patient  was informed of those risks and complications associated with the medications. These include, but are not limited to: allergic reactions (i.e.: anaphylactic or anaphylactoid reaction(s)); adrenal axis suppression; blood sugar elevation that in diabetics may result in ketoacidosis or comma; water retention that in patients with history of congestive heart failure may result in shortness of breath, pulmonary edema, and decompensation with resultant heart failure; weight gain; swelling or edema; medication-induced neural toxicity; particulate matter embolism and blood vessel occlusion with resultant organ, and/or nervous system infarction; and/or aseptic necrosis of one or more joints. Finally, the patient was informed that Medicine is not an exact science; therefore, there is also the possibility of unforeseen or unpredictable risks and/or possible complications that may result in a catastrophic outcome. The patient indicated having understood very clearly. We have given the patient no guarantees and we have made no promises. Enough time was given to the patient to ask questions, all of which were answered to the patient's satisfaction. Mr. Pomper has indicated that he wanted to continue with the procedure. Attestation: I, the ordering provider, attest that I have discussed with the patient the benefits, risks, side-effects, alternatives, likelihood of achieving goals, and potential problems during recovery for the procedure that I have provided informed consent. Date  Time: 05/14/2022 11:29 AM   Pre-Procedure Preparation:  Monitoring: As per clinic protocol. Respiration, ETCO2, SpO2, BP, heart rate and rhythm monitor placed and checked for adequate function Safety Precautions: Patient was assessed for positional comfort and pressure points before starting the procedure. Time-out: I initiated and conducted the "Time-out" before starting the procedure, as per protocol. The patient was asked to participate by  confirming the accuracy of the "Time Out" information. Verification of the correct person, site, and  procedure were performed and confirmed by me, the nursing staff, and the patient. "Time-out" conducted as per Joint Commission's Universal Protocol (UP.01.01.01). Time: 1207  Description/Narrative of Procedure:          Rationale (medical necessity): procedure needed and proper for the diagnosis and/or treatment of the patient's medical symptoms and needs. Procedural Technique Safety Precautions: Aspiration looking for blood return was conducted prior to all injections. At no point did we inject any substances, as a needle was being advanced. No attempts were made at seeking any paresthesias. Safe injection practices and needle disposal techniques used. Medications properly checked for expiration dates. SDV (single dose vial) medications used. Description of the Procedure: Protocol guidelines were followed. The patient was assisted into a comfortable position. The target area was identified and the area prepped in the usual manner. Skin & deeper tissues infiltrated with local anesthetic. Appropriate amount of time allowed to pass for local anesthetics to take effect. The procedure needles were then advanced to the target area. Proper needle placement secured. Negative aspiration confirmed. Solution injected in intermittent fashion, asking for systemic symptoms every 0.5cc of injectate. The needles were then removed and the area cleansed, making sure to leave some of the prepping solution back to take advantage of its long term bactericidal properties.  Technical description of procedure:  Description of Procedure #1:  Target Area: The inferior and lateral portion of the pedicle, just lateral to a line created by the 6:00 position of the pedicle and the superior articular process of the vertebral body below. On the lateral view, this target lies just posterior to the anterior aspect of the lamina and  posterior to the midpoint created between the anterior and the posterior aspect of the neural foramina.  Safety Precautions: Aspiration looking for blood return was conducted prior to all injections. At no point did we inject any substances, as a needle was being advanced. No attempts were made at seeking any paresthesias. Safe injection practices and needle disposal techniques used. Medications properly checked for expiration dates. SDV (single dose vial) medications used.  Description of the Procedure: Protocol guidelines were followed. The patient was placed in position over the fluoroscopy table. The target area was identified and the area prepped in the usual manner. Skin & deeper tissues infiltrated with local anesthetic. Appropriate amount of time allowed to pass for local anesthetics to take effect. The procedure needles were then advanced to the target area. Proper needle placement secured. Negative aspiration confirmed. Solution injected in intermittent fashion, asking for systemic symptoms every 0.2cc of injectate. The needles were then removed and the area cleansed, making sure to leave some of the prepping solution back to take advantage of its long term bactericidal properties.  Start Time: 1207 hrs.  Description of Procedure #2:  Target Area: The  interlaminar space, initially targeting the lower border of the superior vertebral body lamina.  Description of the Procedure: Protocol guidelines were followed. The patient was placed in position over the fluoroscopy table. The target area was identified and the area prepped in the usual manner. Skin & deeper tissues infiltrated with local anesthetic. Appropriate amount of time allowed to pass for local anesthetics to take effect. The procedure needle was introduced through the skin, ipsilateral to the reported pain, and advanced to the target area. Bone was contacted and the needle walked caudad, until the lamina was cleared. The ligamentum  flavum was engaged and loss-of-resistance technique used as the epidural needle was advanced. The epidural space was identified  using "loss-of-resistance technique" with 2-3 ml of PF-NaCl (0.9% NSS), in a 5cc LOR glass syringe. Proper needle placement secured. Negative aspiration confirmed. Solution injected in intermittent fashion, asking for systemic symptoms every 0.5cc of injectate. The needles were then removed and the area cleansed, making sure to leave some of the prepping solution back to take advantage of its long term bactericidal properties.  Vitals:   05/14/22 1130 05/14/22 1205 05/14/22 1215 05/14/22 1221  BP: 108/82 (!) 137/95 (!) 125/91 (!) 128/92  Pulse:  65 64 65  Resp:  '17 16 16  '$ Temp:      SpO2:  100% 99% 100%  Weight:      Height:         End Time: 1219 hrs.  Imaging Guidance (Spinal):          Type of Imaging Technique: Fluoroscopy Guidance (Spinal) Indication(s): Assistance in needle guidance and placement for procedures requiring needle placement in or near specific anatomical locations not easily accessible without such assistance. Exposure Time: Please see nurses notes. Contrast: Before injecting any contrast, we confirmed that the patient did not have an allergy to iodine, shellfish, or radiological contrast. Once satisfactory needle placement was completed at the desired level, radiological contrast was injected. Contrast injected under live fluoroscopy. No contrast complications. See chart for type and volume of contrast used. Fluoroscopic Guidance: I was personally present during the use of fluoroscopy. "Tunnel Vision Technique" used to obtain the best possible view of the target area. Parallax error corrected before commencing the procedure. "Direction-depth-direction" technique used to introduce the needle under continuous pulsed fluoroscopy. Once target was reached, antero-posterior, oblique, and lateral fluoroscopic projection used confirm needle placement in all  planes. Images permanently stored in EMR. Interpretation: I personally interpreted the imaging intraoperatively. Adequate needle placement confirmed in multiple planes. Appropriate spread of contrast into desired area was observed. No evidence of afferent or efferent intravascular uptake. No intrathecal or subarachnoid spread observed. Permanent images saved into the patient's record.  Post-operative Assessment:  Post-procedure Vital Signs:  Pulse/HCG Rate: 65  Temp: 98.6 F (37 C) Resp: 16 BP: (!) 128/92 SpO2: 100 %  EBL: None  Complications: No immediate post-treatment complications observed by team, or reported by patient.  Note: The patient tolerated the entire procedure well. A repeat set of vitals were taken after the procedure and the patient was kept under observation following institutional policy, for this type of procedure. Post-procedural neurological assessment was performed, showing return to baseline, prior to discharge. The patient was provided with post-procedure discharge instructions, including a section on how to identify potential problems. Should any problems arise concerning this procedure, the patient was given instructions to immediately contact us, at any time, without hesitation. In any case, we plan to contact the patient by telephone for a follow-up status report regarding this interventional procedure.  Comments:  No additional relevant information.  Plan of Care (POC)  Orders:  Orders Placed This Encounter  Procedures   Lumbar Transforaminal Epidural    Scheduling Instructions:     Laterality: Bilateral     Level(s): L4             Sedation: Patient's choice.     Timeframe: Today    Order Specific Question:   Where will this procedure be performed?    Answer:   [facility]   Lumbar Epidural Injection    Scheduling Instructions:     Procedure: Interlaminar LESI L3-4     Laterality: Midline  Sedation: Patient's choice     Timeframe: Today     Order Specific Question:   Where will this procedure be performed?    Answer:   [facility]   DG PAIN [facility] C-ARM 1-60 MIN NO REPORT    Intraoperative interpretation by procedural physician at [place].    Standing Status:   Standing    Number of Occurrences:   1    Order Specific Question:   Reason for exam:    Answer:   Assistance in needle guidance and placement for procedures requiring needle placement in or near specific anatomical locations not easily accessible without such assistance.   Informed Consent Details: Physician/Practitioner Attestation; Transcribe to consent form and obtain patient signature    Provider Attestation: I, [place] Dossie Arbour, MD, (Pain Management Specialist), the physician/practitioner, attest that I have discussed with the patient the benefits, risks, side effects, alternatives, likelihood of achieving goals and potential problems during recovery for the procedure that I have provided informed consent.    Scheduling Instructions:     Note: Always confirm laterality of pain with Mr. Chromy, before procedure.     Transcribe to consent form and obtain patient signature.    Order Specific Question:   Physician/Practitioner attestation of informed consent for procedure/surgical case    Answer:   I, the physician/practitioner, attest that I have discussed with the patient the benefits, risks, side effects, alternatives, likelihood of achieving goals and potential problems during recovery for the procedure that I have provided informed consent.    Order Specific Question:   Procedure    Answer:   Diagnostic lumbar transforaminal epidural steroid injection under fluoroscopic guidance. (See notes for level and laterality.)    Order Specific Question:   Physician/Practitioner performing the procedure    Answer:   Merrily Tegeler A. Dossie Arbour, MD    Order Specific Question:   Indication/Reason    Answer:   Lumbar radiculopathy/radiculitis associated with  lumbar stenosis   Informed Consent Details: Physician/Practitioner Attestation; Transcribe to consent form and obtain patient signature    Note: Always confirm laterality of pain with Mr. Pettyjohn, before procedure. Transcribe to consent form and obtain patient signature.    Order Specific Question:   Physician/Practitioner attestation of informed consent for procedure/surgical case    Answer:   I, the physician/practitioner, attest that I have discussed with the patient the benefits, risks, side effects, alternatives, likelihood of achieving goals and potential problems during recovery for the procedure that I have provided informed consent.    Order Specific Question:   Procedure    Answer:   Lumbar epidural steroid injection under fluoroscopic guidance    Order Specific Question:   Physician/Practitioner performing the procedure    Answer:   Pheobe Sandiford A. Dossie Arbour, MD    Order Specific Question:   Indication/Reason    Answer:   Low back and/or lower extremity pain secondary to lumbar radiculitis   Provide equipment / supplies at bedside    Procedural tray: Epidural Tray (Disposable  single use) Skin infiltration needle: Regular 1.5-in, 25-G, (x1) Block needle size: Regular standard Catheter: No catheter required    Standing Status:   Standing    Number of Occurrences:   1    Order Specific Question:   Specify    Answer:   Epidural Tray   Chronic Opioid Analgesic:  None MME/day: 0 mg/day   Medications ordered for procedure: Meds ordered this encounter  Medications   iohexol (OMNIPAQUE) 180 MG/ML injection 10 mL  Must be Myelogram-compatible. If not available, you may substitute with a water-soluble, non-ionic, hypoallergenic, myelogram-compatible radiological contrast medium.   lidocaine (XYLOCAINE) 2 % (with pres) injection 400 mg   pentafluoroprop-tetrafluoroeth (GEBAUERS) aerosol   sodium chloride flush (NS) 0.9 % injection 2 mL   ropivacaine (PF) 2 mg/mL (0.2%) (NAROPIN)  injection 2 mL   triamcinolone acetonide (KENALOG-40) injection 40 mg   sodium chloride flush (NS) 0.9 % injection 2 mL    This is for a two (2) level block. Use two (2) syringes and divide content in half.   ropivacaine (PF) 2 mg/mL (0.2%) (NAROPIN) injection 2 mL    This is for a two (2) level block. Use two (2) syringes and divide content in half.   dexamethasone (DECADRON) injection 20 mg    This is for a two (2) level block. Use two (2) syringes and divide content in half.   Medications administered: We administered iohexol, lidocaine, pentafluoroprop-tetrafluoroeth, sodium chloride flush, ropivacaine (PF) 2 mg/mL (0.2%), triamcinolone acetonide, sodium chloride flush, ropivacaine (PF) 2 mg/mL (0.2%), and dexamethasone.  See the medical record for exact dosing, route, and time of administration.  Follow-up plan:   Return in about 2 weeks (around 05/28/2022) for Proc-day (T,Th), (Face2F), (PPE).       Interventional Therapies  Risk Factors  Considerations:   WNL   Planned  Pending:   Diagnostic midline L3-4 LESI #1 + bilateral L4 TFESI #1    Under consideration:   Diagnostic lumbar facet medial branch MBB #1  Diagnostic bilateral shoulder joint inj. #1  Diagnostic bilateral suprascapular NB #1  Diagnostic cervical ESI #1  Diagnostic cervical facet MBB #1  Diagnostic right knee inj. #1  Diagnostic right genicular NB #1    Completed:   Diagnostic right IA hip inj. x2 (04/14/2022) (1st:100/100/30/30) (2nd:50/50/30/30x 1wk) Diagnostic right trochanteric bursa inj. x2 (04/14/2022) (1st:100/100/30/30) (2nd:50/50/30/30x 1wk)   Therapeutic  Palliative (PRN) options:   None established       Recent Visits Date Type Provider Dept  04/30/22 Office Visit Milinda Pointer, MD [facility]  04/14/22 Procedure visit Milinda Pointer, MD [facility]  Showing recent visits within past 90 days and meeting all other requirements Today's Visits Date Type  Provider Dept  05/14/22 Procedure visit Milinda Pointer, MD [facility]  Showing today's visits and meeting all other requirements Future Appointments Date Type Provider Dept  06/02/22 Appointment Milinda Pointer, MD [facility]  Showing future appointments within next 90 days and meeting all other requirements  Disposition: Discharge home  Discharge (Date  Time): 05/14/2022; 1224 hrs.   Primary Care Physician: Jenne Pane (Inactive) Location: [facility] Note by: Gaspar Cola, MD (TTS technology used. I apologize for any typographical errors that were not detected and corrected.) Date: 05/14/2022; Time: 12:41 PM  Disclaimer:  Medicine is not an Chief Strategy Officer. The only guarantee in medicine is that nothing is guaranteed. It is important to note that the decision to proceed with this intervention was based on the information collected from the patient. The Data and conclusions were drawn from the patient's questionnaire, the interview, and the physical examination. Because the information was provided in large part by the patient, it cannot be guaranteed that it has not been purposely or unconsciously manipulated. Every effort has been made to obtain as much relevant data as possible for this evaluation. It is important to note that the conclusions that lead to this procedure are derived in large part from  the available data. Always take into account that the treatment will also be dependent on availability of resources and existing treatment guidelines, considered by other Pain Management Practitioners as being common knowledge and practice, at the time of the intervention. For Medico-Legal purposes, it is also important to point out that variation in procedural techniques and pharmacological choices are the acceptable norm. The indications, contraindications, technique, and results of the above procedure should only be interpreted and  judged by a Board-Certified Interventional Pain Specialist with extensive familiarity and expertise in the same exact procedure and technique.

## 2022-05-15 ENCOUNTER — Telehealth: Payer: Self-pay

## 2022-05-15 NOTE — Telephone Encounter (Signed)
Called PP.denies any needs at this time.

## 2022-05-19 ENCOUNTER — Ambulatory Visit (INDEPENDENT_AMBULATORY_CARE_PROVIDER_SITE_OTHER): Payer: No Typology Code available for payment source

## 2022-05-19 DIAGNOSIS — J309 Allergic rhinitis, unspecified: Secondary | ICD-10-CM

## 2022-05-25 ENCOUNTER — Ambulatory Visit (INDEPENDENT_AMBULATORY_CARE_PROVIDER_SITE_OTHER): Payer: No Typology Code available for payment source | Admitting: *Deleted

## 2022-05-25 DIAGNOSIS — J309 Allergic rhinitis, unspecified: Secondary | ICD-10-CM

## 2022-05-31 NOTE — Progress Notes (Unsigned)
PROVIDER NOTE: Information contained herein reflects review and annotations entered in association with encounter. Interpretation of such information and data should be left to medically-trained personnel. Information provided to patient can be located elsewhere in the medical record under "Patient Instructions". Document created using STT-dictation technology, any transcriptional errors that may result from process are unintentional.    Patient: [name]  Service Category: E/M  Provider: Gaspar Cola, MD  DOB: 1960/07/03  DOS: 06/02/2022  Referring Provider: No ref. provider found  MRN: [id number]  Specialty: Interventional Pain Management  PCP: Jenne Pane (Inactive)  Type: Established Patient  Setting: Ambulatory outpatient    Location: Office  Delivery: Face-to-face     HPI  [name], a 62 y.o. year old male, is here today because of his No primary diagnosis found.. Mr. Moure's primary complain today is No chief complaint on file.  Pertinent problems: Mr. Wickizer has Gout; Chronic shoulder pain (Right); Chronic low back pain (2ry area of Pain) (Midline) w/o sciatica; Postlaminectomy syndrome, cervical region; Chronic pain syndrome; Chronic knee pain (5th area of Pain) (Right); Chronic hip pain (1ry area of Pain) (Right); Enthesopathy of hip region (Right); Chronic groin pain (Right); Chronic low back pain (Right) w/o sciatica; Lumbar facet syndrome (Bilateral) (R>L); Chronic shoulder pain (3ry area of Pain) (Bilateral) (R>L); Chronic neck pain with history of cervical spinal surgery (4th area of Pain); Trochanteric bursitis of hip (Right); Primary osteoarthritis of knee (Right); Carpal tunnel syndrome; Cervical radiculopathy; Degenerative joint disease of shoulder (Right); Impingement syndrome of shoulder region; Lesion of ulnar nerve (Right); Sprain of sacroiliac joint; Osteoarthritis of hip (Right); Pain in hip (Right); Pain in shoulder (Left); Pain in elbow (Right); Gluteal  tendinopathy (Right); DDD (degenerative disc disease), lumbosacral; Lumbar facet hypertrophy (Multilevel) (Bilateral); Lumbar 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 Pain Assessment: Severity of   is reported as a  /10. Location:    / . Onset:  . Quality:  . Timing:  . Modifying factor(s):  Marland Kitchen Vitals:  vitals were not taken for this visit.  BMI: Estimated body mass index is 29.41 kg/m as calculated from the following:   Height as of 05/14/22: 5\' 10"  (1.778 m).   Weight as of 05/14/22: 205 lb (93 kg). Last encounter: 04/30/2022. Last procedure: 05/14/2022.  Reason for encounter:  *** . ***  Pharmacotherapy Assessment  Analgesic: None MME/day: 0 mg/day   Monitoring: [place] PMP: PDMP reviewed during this encounter.       Pharmacotherapy: No side-effects or adverse reactions reported. Compliance: No problems identified. Effectiveness: Clinically acceptable.  No notes on file  No results found for: "CBDTHCR" No results found for: "D8THCCBX" No results found for: "D9THCCBX"  UDS:  Summary  Date Value Ref Range Status  03/17/2021 Note  Final    Comment:    ==================================================================== Compliance Drug Analysis, Ur ==================================================================== Test                             Result       Flag       Units  Drug Present and Declared for Prescription Verification   Pregabalin  PRESENT      EXPECTED  Drug Present not Declared for Prescription Verification   Acetaminophen                   PRESENT      UNEXPECTED ==================================================================== Test                      Result    Flag   Units      Ref Range   Creatinine              127              mg/dL      >=20 ==================================================================== Declared Medications:  The flagging and interpretation on this report are based on the  following declared medications.  Unexpected results may arise from  inaccuracies in the declared medications.   **Note: The testing scope of this panel includes these medications:   Pregabalin (Lyrica)   **Note: The testing scope of this panel does not include the  following reported medications:   Calcium  Cetirizine (Zyrtec)  Clindamycin (Cleocin)  Fish Oil  Ipratropium (Atrovent)  Ketotifen (Zaditor)  Montelukast (Singulair)  Sildenafil (Viagra)  Vitamin B  Vitamin D3 ==================================================================== For clinical consultation, please call [phone]. ====================================================================       ROS  Constitutional: Denies any fever or chills Gastrointestinal: No reported hemesis, hematochezia, vomiting, or acute GI distress Musculoskeletal: Denies any acute onset joint swelling, redness, loss of ROM, or weakness Neurological: No reported episodes of acute onset apraxia, aphasia, dysarthria, agnosia, amnesia, paralysis, loss of coordination, or loss of consciousness  Medication Review  EPINEPHrine, Fish Oil, b complex vitamins, calcium citrate, chlorpheniramine, cholecalciferol, clindamycin, montelukast, pregabalin, and sildenafil  History Review  Allergy: Mr. Beltram has No Known Allergies. Drug: Mr. Debolt  reports no history of drug use. Alcohol:  reports no history of alcohol use. Tobacco:  reports that he has never smoked. He does not have any smokeless tobacco history on file. Social: Mr. Bassani  reports that he has never smoked.  He does not have any smokeless tobacco history on file. He reports that he does not drink alcohol and does not use drugs. Medical:  has a past medical history of Allergy, Arthritis, Chronic neck pain, Gout, History of kidney stones, Hyperlipidemia, Joint pain, and Sleep apnea. Surgical: Mr. Cassone  has a past surgical history that includes Shoulder surgery (3yrs ago and then 2013); Carpal tunnel release (2008); Cervical fusion (2003); Knee arthroscopy (15+yrs ago); Vasectomy (66yrs ago); Shoulder surgery (33yrs ago); colonosocpy; Circumcision (53yrs); and Anterior cervical decomp/discectomy fusion (11/19/2011). Family: family history includes Cancer in his mother and sister.  Laboratory Chemistry Profile   Renal Lab Results  Component Value Date   BUN 8 03/17/2021   CREATININE 0.99 03/17/2021   BCR 8 (L) 03/17/2021   GFRAA 84 08/24/2013   GFRNONAA 72 08/24/2013    Hepatic Lab Results  Component Value Date   AST 32 03/17/2021   ALT 16 08/24/2013   ALBUMIN 4.3 03/17/2021   ALKPHOS 70 03/17/2021    Electrolytes Lab Results  Component Value Date   NA 142 03/17/2021   K 4.5 03/17/2021   CL 103 03/17/2021   CALCIUM 9.9 03/17/2021   MG 2.2 03/17/2021    Bone Lab Results  Component Value Date   VD25OH 29 (L) 07/14/2012   25OHVITD1 39 03/17/2021   25OHVITD2 <1.0 03/17/2021   25OHVITD3 39 03/17/2021   TESTOFREE 87.2 07/14/2012   TESTOSTERONE  352 07/14/2012    Inflammation (CRP: Acute Phase) (ESR: Chronic Phase) Lab Results  Component Value Date   CRP <1 03/17/2021   ESRSEDRATE 9 03/17/2021         Note: Above Lab results reviewed.  Recent Imaging Review  DG PAIN CLINIC C-ARM 1-60 MIN NO REPORT Fluoro was used, but no Radiologist interpretation will be provided.  Please refer to "NOTES" tab for provider progress note. Note: Reviewed        Physical Exam  General appearance: Well nourished, well developed, and well hydrated. In no apparent acute distress Mental status:  Alert, oriented x 3 (person, place, & time)       Respiratory: No evidence of acute respiratory distress Eyes: PERLA Vitals: There were no vitals taken for this visit. BMI: Estimated body mass index is 29.41 kg/m as calculated from the following:   Height as of 05/14/22: 5\' 10"  (1.778 m).   Weight as of 05/14/22: 205 lb (93 kg). Ideal: Patient weight not recorded  Assessment   Diagnosis Status  No diagnosis found. Controlled Controlled Controlled   Updated Problems: No problems updated.  Plan of Care  Problem-specific:  No problem-specific Assessment & Plan notes found for this encounter.  Mr. ALANTE JASIN has a current medication list which includes the following long-term medication(s): calcium citrate, chlorpheniramine, montelukast, pregabalin, and sildenafil.  Pharmacotherapy (Medications Ordered): No orders of the defined types were placed in this encounter.  Orders:  No orders of the defined types were placed in this encounter.  Follow-up plan:   No follow-ups on file.      Interventional Therapies  Risk Factors  Considerations:   WNL   Planned  Pending:   Diagnostic midline L3-4 LESI #1 + bilateral L4 TFESI #1    Under consideration:   Diagnostic lumbar facet medial branch MBB #1  Diagnostic bilateral shoulder joint inj. #1  Diagnostic bilateral suprascapular NB #1  Diagnostic cervical ESI #1  Diagnostic cervical facet MBB #1  Diagnostic right knee inj. #1  Diagnostic right genicular NB #1    Completed:   Diagnostic right IA hip inj. x2 (04/14/2022) (1st:100/100/30/30) (2nd:50/50/30/30x 1wk) Diagnostic right trochanteric bursa inj. x2 (04/14/2022) (1st:100/100/30/30) (2nd:50/50/30/30x 1wk)   Therapeutic  Palliative (PRN) options:   None established        Recent Visits Date Type Provider Dept  05/14/22 Procedure visit Milinda Pointer, MD [facility]  04/30/22 Office Visit Milinda Pointer, MD [facility]  04/14/22  Procedure visit Milinda Pointer, MD [facility]  Showing recent visits within past 90 days and meeting all other requirements Future Appointments Date Type Provider Dept  06/02/22 Appointment Milinda Pointer, MD [facility]  Showing future appointments within next 90 days and meeting all other requirements  I discussed the assessment and treatment plan with the patient. The patient was provided an opportunity to ask questions and all were answered. The patient agreed with the plan and demonstrated an understanding of the instructions.  Patient advised to call back or seek an in-person evaluation if the symptoms or condition worsens.  Duration of encounter: *** minutes.  Total time on encounter, as per AMA guidelines included both the face-to-face and non-face-to-face time personally spent by the physician and/or other qualified health care professional(s) on the day of the encounter (includes time in activities that require the physician or other qualified health care professional and does not include time in activities normally performed by clinical staff). Physician's time may include the following  activities when performed: Preparing to see the patient (e.g., pre-charting review of records, searching for previously ordered imaging, lab work, and nerve conduction tests) Review of prior analgesic pharmacotherapies. Reviewing PMP Interpreting ordered tests (e.g., lab work, imaging, nerve conduction tests) Performing post-procedure evaluations, including interpretation of diagnostic procedures Obtaining and/or reviewing separately obtained history Performing a medically appropriate examination and/or evaluation Counseling and educating the patient/family/caregiver Ordering medications, tests, or procedures Referring and communicating with other health care professionals (when not separately reported) Documenting clinical information in the electronic or other health  record Independently interpreting results (not separately reported) and communicating results to the patient/ family/caregiver Care coordination (not separately reported)  Note by: Gaspar Cola, MD Date: 06/02/2022; Time: 6:43 PM

## 2022-06-02 ENCOUNTER — Encounter: Payer: Self-pay | Admitting: Pain Medicine

## 2022-06-02 ENCOUNTER — Ambulatory Visit: Payer: No Typology Code available for payment source | Attending: Pain Medicine | Admitting: Pain Medicine

## 2022-06-02 VITALS — BP 134/77 | HR 73 | Temp 98.2°F | Resp 14 | Ht 70.0 in | Wt 205.0 lb

## 2022-06-02 DIAGNOSIS — M25852 Other specified joint disorders, left hip: Secondary | ICD-10-CM

## 2022-06-02 DIAGNOSIS — R1031 Right lower quadrant pain: Secondary | ICD-10-CM

## 2022-06-02 DIAGNOSIS — M1611 Unilateral primary osteoarthritis, right hip: Secondary | ICD-10-CM | POA: Diagnosis present

## 2022-06-02 DIAGNOSIS — M7061 Trochanteric bursitis, right hip: Secondary | ICD-10-CM

## 2022-06-02 DIAGNOSIS — M25651 Stiffness of right hip, not elsewhere classified: Secondary | ICD-10-CM

## 2022-06-02 DIAGNOSIS — M25652 Stiffness of left hip, not elsewhere classified: Secondary | ICD-10-CM

## 2022-06-02 DIAGNOSIS — M25851 Other specified joint disorders, right hip: Secondary | ICD-10-CM | POA: Diagnosis present

## 2022-06-02 DIAGNOSIS — G8929 Other chronic pain: Secondary | ICD-10-CM

## 2022-06-02 DIAGNOSIS — M25551 Pain in right hip: Secondary | ICD-10-CM | POA: Diagnosis not present

## 2022-06-02 DIAGNOSIS — M5416 Radiculopathy, lumbar region: Secondary | ICD-10-CM | POA: Diagnosis not present

## 2022-06-02 DIAGNOSIS — M545 Low back pain, unspecified: Secondary | ICD-10-CM

## 2022-06-04 ENCOUNTER — Ambulatory Visit (INDEPENDENT_AMBULATORY_CARE_PROVIDER_SITE_OTHER): Payer: No Typology Code available for payment source | Admitting: *Deleted

## 2022-06-04 DIAGNOSIS — J309 Allergic rhinitis, unspecified: Secondary | ICD-10-CM | POA: Diagnosis not present

## 2022-06-08 ENCOUNTER — Ambulatory Visit (INDEPENDENT_AMBULATORY_CARE_PROVIDER_SITE_OTHER): Payer: No Typology Code available for payment source | Admitting: *Deleted

## 2022-06-08 DIAGNOSIS — J309 Allergic rhinitis, unspecified: Secondary | ICD-10-CM

## 2022-06-15 ENCOUNTER — Ambulatory Visit (INDEPENDENT_AMBULATORY_CARE_PROVIDER_SITE_OTHER): Payer: No Typology Code available for payment source | Admitting: *Deleted

## 2022-06-15 DIAGNOSIS — J309 Allergic rhinitis, unspecified: Secondary | ICD-10-CM

## 2022-06-16 NOTE — Progress Notes (Signed)
VIALS EXP 06-16-23 

## 2022-06-17 DIAGNOSIS — J301 Allergic rhinitis due to pollen: Secondary | ICD-10-CM | POA: Diagnosis not present

## 2022-06-18 DIAGNOSIS — J302 Other seasonal allergic rhinitis: Secondary | ICD-10-CM | POA: Diagnosis not present

## 2022-06-22 ENCOUNTER — Ambulatory Visit (INDEPENDENT_AMBULATORY_CARE_PROVIDER_SITE_OTHER): Payer: No Typology Code available for payment source

## 2022-06-22 DIAGNOSIS — J309 Allergic rhinitis, unspecified: Secondary | ICD-10-CM | POA: Diagnosis not present

## 2022-06-29 ENCOUNTER — Ambulatory Visit (INDEPENDENT_AMBULATORY_CARE_PROVIDER_SITE_OTHER): Payer: No Typology Code available for payment source

## 2022-06-29 DIAGNOSIS — J309 Allergic rhinitis, unspecified: Secondary | ICD-10-CM

## 2022-07-07 ENCOUNTER — Ambulatory Visit (INDEPENDENT_AMBULATORY_CARE_PROVIDER_SITE_OTHER): Payer: No Typology Code available for payment source

## 2022-07-07 DIAGNOSIS — J309 Allergic rhinitis, unspecified: Secondary | ICD-10-CM

## 2022-07-13 ENCOUNTER — Ambulatory Visit (INDEPENDENT_AMBULATORY_CARE_PROVIDER_SITE_OTHER): Payer: No Typology Code available for payment source | Admitting: *Deleted

## 2022-07-13 DIAGNOSIS — J309 Allergic rhinitis, unspecified: Secondary | ICD-10-CM | POA: Diagnosis not present

## 2022-07-20 ENCOUNTER — Ambulatory Visit (INDEPENDENT_AMBULATORY_CARE_PROVIDER_SITE_OTHER): Payer: No Typology Code available for payment source | Admitting: *Deleted

## 2022-07-20 DIAGNOSIS — J309 Allergic rhinitis, unspecified: Secondary | ICD-10-CM

## 2022-07-27 ENCOUNTER — Ambulatory Visit (INDEPENDENT_AMBULATORY_CARE_PROVIDER_SITE_OTHER): Payer: No Typology Code available for payment source | Admitting: *Deleted

## 2022-07-27 DIAGNOSIS — J309 Allergic rhinitis, unspecified: Secondary | ICD-10-CM | POA: Diagnosis not present

## 2022-08-04 ENCOUNTER — Ambulatory Visit (INDEPENDENT_AMBULATORY_CARE_PROVIDER_SITE_OTHER): Payer: No Typology Code available for payment source | Admitting: *Deleted

## 2022-08-04 DIAGNOSIS — J309 Allergic rhinitis, unspecified: Secondary | ICD-10-CM | POA: Diagnosis not present

## 2022-08-05 ENCOUNTER — Telehealth: Payer: Self-pay | Admitting: Allergy & Immunology

## 2022-08-05 NOTE — Telephone Encounter (Signed)
Received message from [place].   Patient's current [place] Authorization [id number]) expires 08-05-2022.   Request for renewal of services has been faxed to [facility] [phone] Allergy & Dermatology Department).  Called patient & advised of expiration date. Since patient is on weekly injections, advised patient to hold off on injections until he hears back from our office with new authorization. Patient verbalized understanding and expressed gratitude.

## 2022-08-05 NOTE — Telephone Encounter (Signed)
-----   Message from Sherri Sear sent at 07/29/2022  4:22 PM EDT ----- Regarding: VA Referral Ky[place]re's VA referral expires 08/05/22. I don't see anything has been requested. Thanks.

## 2022-10-22 ENCOUNTER — Telehealth: Payer: Self-pay | Admitting: Allergy & Immunology

## 2022-10-22 NOTE — Telephone Encounter (Signed)
Call was sent in for [facility] status of REF, "[id number]" good call back number [phone] Amil Amen. K

## 2022-10-28 NOTE — Telephone Encounter (Signed)
Received new authorization, [id number] with dates of 11-05-2022 to 11-05-2023  Authorization has been indexed to media:  AMB Referral - [facility] AUTH. 11-05-22 TO 11-05-23  Called patient to advise him of authorization and its dates. Advised patient to keep his appointment for 11-05-2022. Patient is interested in the immunotherapy - we however were waiting on the authorization. Patient will keep appointment.

## 2022-11-05 ENCOUNTER — Other Ambulatory Visit: Payer: Self-pay

## 2022-11-05 ENCOUNTER — Encounter: Payer: Self-pay | Admitting: Allergy & Immunology

## 2022-11-05 ENCOUNTER — Ambulatory Visit: Payer: No Typology Code available for payment source | Admitting: Allergy & Immunology

## 2022-11-05 VITALS — BP 110/80 | HR 65 | Temp 98.4°F | Ht 70.0 in | Wt 210.7 lb

## 2022-11-05 DIAGNOSIS — J302 Other seasonal allergic rhinitis: Secondary | ICD-10-CM

## 2022-11-05 DIAGNOSIS — J3089 Other allergic rhinitis: Secondary | ICD-10-CM

## 2022-11-05 NOTE — Progress Notes (Signed)
Glenn UP  Date of Service/Encounter:  11/05/22   Assessment:   Seasonal Glenn perennial allergic rhinitis (Pierce, [name], [name], [name], Glenn cockroach) - on allergen immunotherapy with maintenance reached February 2024   Rosacea    Retired veteran  Plan/Recommendations:   1. Seasonal Glenn perennial allergic rhinitis - Previous testing was positive to: Pierce, [name], [name], [name], [name] measures provided. - Continue with: Singulair (montelukast) 10mg  daily Glenn chlorpheniramine 4 mg at night. - We are restarting your allergy shots today, but we do not need to go all the way back to the beginning.  2. Return in about 6 months (around 05/07/2023).   Subjective:   [name] is a 62 y.o. male presenting today for Glenn up of  Chief Complaint  Patient presents with   Glenn-up    Seasonal Glenn perennial allergic rhinitis.  No concerns    [name] has a history of the following: Patient Active Problem List   Diagnosis Date Noted   Decreased range of motion of hips (Bilateral) (R>L) 06/02/2022   Chronic radicular lumbar pain 05/14/2022   Lumbar radicular pain 05/14/2022   Allergic rhinitis, unspecified 05/04/2022   Paresthesia of skin 05/04/2022   Presbyopia 05/04/2022   Encounter for other administrative examinations 05/04/2022   Sacrococcygeal disorders, not elsewhere classified 05/04/2022   Unilateral primary osteoarthritis of hip (Right) 05/04/2022   Abnormal MRI, hip (Right) (02/14/2020) [facility] [place]) 05/04/2022   Abnormal MRI, lumbar spine (02/15/2020) [facility] [place]) 05/04/2022   Lumbar radiculitis (Bilateral) 04/30/2022   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   Trochanteric bursitis of hip (Right) 03/18/2021   Complex renal cyst 03/18/2021   Impotence 03/18/2021   Primary osteoarthritis of knee (Right) 03/18/2021   Carpal tunnel syndrome 03/18/2021   Cervical radiculopathy 03/18/2021   Degenerative  joint disease of shoulder (Right) 03/18/2021   Encounter for surgical aftercare following surgery on the nervous system 03/18/2021   Enlarged prostate 03/18/2021   Gross hematuria 03/18/2021   Impingement syndrome of shoulder region 03/18/2021   Kidney stone 03/18/2021   Lesion of ulnar nerve (Right) 03/18/2021   Male erectile dysfunction, unspecified 03/18/2021   Primary erectile dysfunction 03/18/2021   Obesity 03/18/2021   Obstructive sleep apnea (adult) (pediatric) 03/18/2021   Obstructive sleep apnea of adult 03/18/2021   Sprain of sacroiliac joint 03/18/2021   Osteoarthritis of hip (Right) 03/18/2021   Unspecified disorder of refraction 03/18/2021   Vitamin D deficiency 03/18/2021   Pain in hip (Right) 03/18/2021   Encounter for other preprocedural examination 03/18/2021   Encounter for immunization 03/18/2021   Counseling, unspecified 03/18/2021   Pain in shoulder (Left) 03/18/2021   Pain in elbow (Right) 03/18/2021   Gluteal tendinopathy (Right) 03/18/2021   DDD (degenerative disc disease), lumbosacral 03/18/2021   Lumbar facet hypertrophy (Multilevel) (Bilateral) 03/18/2021   Lumbar foraminal stenosis (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, L4-5) (Left: L5-S1) 03/18/2021   Chronic hip pain (1ry area of Pain) (Right) 03/17/2021   Enthesopathy of hip region (Right) 03/17/2021   Chronic groin pain (Right) 03/17/2021   Chronic low back pain (Right) w/o sciatica 03/17/2021   Lumbar facet syndrome (Bilateral) (R>L) 03/17/2021   Chronic shoulder pain (3ry area of Pain) (Bilateral) (R>L) 03/17/2021   Chronic neck pain with history of cervical spinal surgery (4th area of Pain) 03/17/2021   Chronic pain syndrome 03/13/2021   Pharmacologic therapy 03/13/2021   Disorder of skeletal system 03/13/2021   Problems influencing health status 03/13/2021   Chronic  knee pain (5th area of Pain) (Right) 11/25/2017   Postlaminectomy syndrome, cervical region 04/12/2013   Chronic low back pain (2ry area of Pain)  (Midline) w/o sciatica 02/06/2013   Chronic shoulder pain (Right) 12/30/2012   Hyperlipidemia 10/11/2011   Gout 10/11/2011   Acne 10/11/2011    History obtained from: chart review Glenn patient.  [name] is a 62 y.o. male presenting for a Glenn up visit.  He was last seen in February 2024.  At that time, he continue with Singulair 10 mg daily Glenn chlorpheniramine 4 mg at night.  He was also on allergy shots Glenn doing well with those.  In the interim, he stopped shots because of VA coverage issues. This has all been sorted out. He has had a cough for two weeks Glenn just treated it with cough drops. He did not need steroids or antibiotics.   [name] is on allergen immunotherapy. He receives two injections. Immunotherapy script #1 contains [name] [name]. He currently receives 0.99mL of the RED vial (1/100). Immunotherapy script #2 contains Pierce Glenn cockroach. He currently receives 0.89mL of the RED vial (1/100). He started shots September of 2023 Glenn reached maintenance in February of 2024.  He has not had any large local reactions. He did feel better with the shots on board. He remains interested   He remains on the Singulair Glenn the Chlrotab at night. He does not use nose sprays.  He never felt like they helped.  He has not had new surgeries or changes since the last visit. He did not travel anywhere over the summer.   Otherwise, there have been no changes to his past medical history, surgical history, family history, or social history.    Review of systems otherwise negative other than that mentioned in the HPI.    Objective:   Blood pressure 110/80, pulse 65, temperature 98.4 F (36.9 C), height 5\' 10"  (1.778 m), weight 210 lb 11.2 oz (95.6 kg), SpO2 98%. Body mass index is 30.23 kg/m.    Physical Exam Vitals reviewed.  Constitutional:      Appearance: He is well-developed.     Comments: Very pleasant.  Talkative. Very nice.   HENT:     Head: Normocephalic Glenn atraumatic.      Right Ear: Tympanic membrane, ear canal Glenn external ear normal. No drainage, swelling or tenderness. Tympanic membrane is not injected, scarred, erythematous, retracted or bulging.     Left Ear: Tympanic membrane, ear canal Glenn external ear normal. No drainage, swelling or tenderness. Tympanic membrane is not injected, scarred, erythematous, retracted or bulging.     Nose: No nasal deformity, septal deviation, mucosal edema or rhinorrhea.     Right Turbinates: Enlarged, swollen Glenn pale.     Left Turbinates: Enlarged, swollen Glenn pale.     Right Sinus: No maxillary sinus tenderness or frontal sinus tenderness.     Left Sinus: No maxillary sinus tenderness or frontal sinus tenderness.     Comments: No nasal polyps.     Mouth/Throat:     Mouth: Mucous membranes are not pale Glenn not dry.     Pharynx: Uvula midline.  Eyes:     General: Lids are normal.        Right eye: No discharge.        Left eye: No discharge.     Conjunctiva/sclera: Conjunctivae normal.     Right eye: Right conjunctiva is not injected. No chemosis.    Left eye: Left conjunctiva is not injected. No  chemosis.    Pupils: Pupils are equal, round, Glenn reactive to light.  Cardiovascular:     Rate Glenn Rhythm: Normal rate Glenn regular rhythm.     Heart sounds: Normal heart sounds.  Pulmonary:     Effort: Pulmonary effort is normal. No tachypnea, accessory muscle usage or respiratory distress.     Breath sounds: Normal breath sounds. No wheezing, rhonchi or rales.     Comments: Moving air well in all lung fields. No increased work of breathing noted.  Chest:     Chest wall: No tenderness.  Lymphadenopathy:     Head:     Right side of head: No submandibular, tonsillar or occipital adenopathy.     Left side of head: No submandibular, tonsillar or occipital adenopathy.     Cervical: No cervical adenopathy.  Skin:    General: Skin is warm.     Capillary Refill: Capillary refill takes less than 2 seconds.     Coloration: Skin  is not pale.     Findings: No abrasion, erythema, petechiae or rash. Rash is not papular, urticarial or vesicular.     Comments: No wheezing or crackles noted.   Neurological:     Mental Status: He is alert.  Psychiatric:        Behavior: Behavior is cooperative.      Diagnostic studies: none     Malachi Bonds, MD  [facility]

## 2022-11-05 NOTE — Patient Instructions (Addendum)
1. Seasonal and perennial allergic rhinitis - Previous testing was positive to: weeds, trees, indoor molds, dust mites, and cockroach. - Avoidance measures provided. - Continue with: Singulair (montelukast) 10mg  daily and chlorpheniramine 4 mg at night. - We are restarting your allergy shots today, but we do not need to go all the way back to the beginning.  2. Return in about 6 months (around 05/07/2023).    Please inform us of any Emergency Department visits, hospitalizations, or changes in symptoms. Call us before going to the ED for breathing or allergy symptoms since we might be able to fit you in for a sick visit. Feel free to contact us anytime with any questions, problems, or concerns.  It was a pleasure to see you today!  Websites that have reliable patient information: 1. American Academy of Asthma, Allergy, and Immunology: www.aaaai.org 2. Food Allergy Research and Education (FARE): foodallergy.org 3. Mothers of Asthmatics: http://www.asthmacommunitynetwork.org 4. American College of Allergy, Asthma, and Immunology: www.acaai.org   COVID-19 Vaccine Information can be found at: [email or web address] For questions related to vaccine distribution or appointments, please email [email or web address] or call [phone].   We realize that you might be concerned about having an allergic reaction to the COVID19 vaccines. To help with that concern, WE ARE OFFERING THE COVID19 VACCINES IN OUR OFFICE! Ask the front desk for dates!     "Like" [place] on Facebook and Instagram for our latest updates!      A healthy democracy works best when [employer] participate! Make sure you are registered to vote! If you have moved or changed any of your contact information, you will need to get this updated before voting!  In some cases, you MAY be able to register to vote online: [email or web address]

## 2022-11-11 ENCOUNTER — Ambulatory Visit (INDEPENDENT_AMBULATORY_CARE_PROVIDER_SITE_OTHER): Payer: No Typology Code available for payment source | Admitting: *Deleted

## 2022-11-11 DIAGNOSIS — J309 Allergic rhinitis, unspecified: Secondary | ICD-10-CM | POA: Diagnosis not present

## 2022-11-17 ENCOUNTER — Ambulatory Visit (INDEPENDENT_AMBULATORY_CARE_PROVIDER_SITE_OTHER): Payer: No Typology Code available for payment source

## 2022-11-17 DIAGNOSIS — J309 Allergic rhinitis, unspecified: Secondary | ICD-10-CM

## 2022-11-24 ENCOUNTER — Ambulatory Visit (INDEPENDENT_AMBULATORY_CARE_PROVIDER_SITE_OTHER): Payer: No Typology Code available for payment source | Admitting: *Deleted

## 2022-11-24 DIAGNOSIS — J309 Allergic rhinitis, unspecified: Secondary | ICD-10-CM | POA: Diagnosis not present

## 2022-12-03 ENCOUNTER — Ambulatory Visit (INDEPENDENT_AMBULATORY_CARE_PROVIDER_SITE_OTHER): Payer: No Typology Code available for payment source | Admitting: *Deleted

## 2022-12-03 DIAGNOSIS — J309 Allergic rhinitis, unspecified: Secondary | ICD-10-CM | POA: Diagnosis not present

## 2022-12-08 ENCOUNTER — Ambulatory Visit (INDEPENDENT_AMBULATORY_CARE_PROVIDER_SITE_OTHER): Payer: No Typology Code available for payment source | Admitting: *Deleted

## 2022-12-08 DIAGNOSIS — J309 Allergic rhinitis, unspecified: Secondary | ICD-10-CM

## 2022-12-15 ENCOUNTER — Ambulatory Visit (INDEPENDENT_AMBULATORY_CARE_PROVIDER_SITE_OTHER): Payer: No Typology Code available for payment source | Admitting: *Deleted

## 2022-12-15 DIAGNOSIS — J309 Allergic rhinitis, unspecified: Secondary | ICD-10-CM | POA: Diagnosis not present

## 2022-12-21 ENCOUNTER — Ambulatory Visit (INDEPENDENT_AMBULATORY_CARE_PROVIDER_SITE_OTHER): Payer: No Typology Code available for payment source

## 2022-12-21 DIAGNOSIS — J309 Allergic rhinitis, unspecified: Secondary | ICD-10-CM

## 2022-12-28 ENCOUNTER — Ambulatory Visit (INDEPENDENT_AMBULATORY_CARE_PROVIDER_SITE_OTHER): Payer: No Typology Code available for payment source

## 2022-12-28 DIAGNOSIS — J309 Allergic rhinitis, unspecified: Secondary | ICD-10-CM

## 2023-01-05 ENCOUNTER — Ambulatory Visit (INDEPENDENT_AMBULATORY_CARE_PROVIDER_SITE_OTHER): Payer: No Typology Code available for payment source | Admitting: *Deleted

## 2023-01-05 DIAGNOSIS — J309 Allergic rhinitis, unspecified: Secondary | ICD-10-CM | POA: Diagnosis not present

## 2023-01-12 ENCOUNTER — Ambulatory Visit (INDEPENDENT_AMBULATORY_CARE_PROVIDER_SITE_OTHER): Payer: No Typology Code available for payment source | Admitting: *Deleted

## 2023-01-12 DIAGNOSIS — J309 Allergic rhinitis, unspecified: Secondary | ICD-10-CM

## 2023-01-19 ENCOUNTER — Ambulatory Visit (INDEPENDENT_AMBULATORY_CARE_PROVIDER_SITE_OTHER): Payer: No Typology Code available for payment source | Admitting: *Deleted

## 2023-01-19 DIAGNOSIS — J309 Allergic rhinitis, unspecified: Secondary | ICD-10-CM | POA: Diagnosis not present

## 2023-01-26 ENCOUNTER — Ambulatory Visit (INDEPENDENT_AMBULATORY_CARE_PROVIDER_SITE_OTHER): Payer: No Typology Code available for payment source | Admitting: *Deleted

## 2023-01-26 DIAGNOSIS — J309 Allergic rhinitis, unspecified: Secondary | ICD-10-CM | POA: Diagnosis not present

## 2023-02-08 ENCOUNTER — Ambulatory Visit (INDEPENDENT_AMBULATORY_CARE_PROVIDER_SITE_OTHER): Payer: No Typology Code available for payment source | Admitting: *Deleted

## 2023-02-08 DIAGNOSIS — J309 Allergic rhinitis, unspecified: Secondary | ICD-10-CM

## 2023-02-11 NOTE — Progress Notes (Signed)
VIALS EXP 04-03-23

## 2023-02-15 DIAGNOSIS — J301 Allergic rhinitis due to pollen: Secondary | ICD-10-CM | POA: Diagnosis not present

## 2023-02-16 DIAGNOSIS — J302 Other seasonal allergic rhinitis: Secondary | ICD-10-CM | POA: Diagnosis not present

## 2023-03-01 ENCOUNTER — Ambulatory Visit (INDEPENDENT_AMBULATORY_CARE_PROVIDER_SITE_OTHER): Payer: No Typology Code available for payment source

## 2023-03-01 DIAGNOSIS — J309 Allergic rhinitis, unspecified: Secondary | ICD-10-CM | POA: Diagnosis not present

## 2023-03-08 ENCOUNTER — Ambulatory Visit (INDEPENDENT_AMBULATORY_CARE_PROVIDER_SITE_OTHER): Payer: No Typology Code available for payment source | Admitting: *Deleted

## 2023-03-08 DIAGNOSIS — J309 Allergic rhinitis, unspecified: Secondary | ICD-10-CM

## 2023-03-16 ENCOUNTER — Ambulatory Visit (INDEPENDENT_AMBULATORY_CARE_PROVIDER_SITE_OTHER): Payer: No Typology Code available for payment source

## 2023-03-16 DIAGNOSIS — J309 Allergic rhinitis, unspecified: Secondary | ICD-10-CM | POA: Diagnosis not present

## 2023-03-22 ENCOUNTER — Ambulatory Visit (INDEPENDENT_AMBULATORY_CARE_PROVIDER_SITE_OTHER): Payer: No Typology Code available for payment source | Admitting: *Deleted

## 2023-03-22 DIAGNOSIS — J309 Allergic rhinitis, unspecified: Secondary | ICD-10-CM

## 2023-04-01 ENCOUNTER — Ambulatory Visit (INDEPENDENT_AMBULATORY_CARE_PROVIDER_SITE_OTHER): Payer: No Typology Code available for payment source | Admitting: *Deleted

## 2023-04-01 DIAGNOSIS — J309 Allergic rhinitis, unspecified: Secondary | ICD-10-CM

## 2023-04-05 ENCOUNTER — Ambulatory Visit (INDEPENDENT_AMBULATORY_CARE_PROVIDER_SITE_OTHER): Payer: No Typology Code available for payment source | Admitting: *Deleted

## 2023-04-05 DIAGNOSIS — J309 Allergic rhinitis, unspecified: Secondary | ICD-10-CM

## 2023-04-12 ENCOUNTER — Ambulatory Visit (INDEPENDENT_AMBULATORY_CARE_PROVIDER_SITE_OTHER): Payer: No Typology Code available for payment source | Admitting: *Deleted

## 2023-04-12 DIAGNOSIS — J309 Allergic rhinitis, unspecified: Secondary | ICD-10-CM

## 2023-04-19 ENCOUNTER — Ambulatory Visit (INDEPENDENT_AMBULATORY_CARE_PROVIDER_SITE_OTHER): Payer: No Typology Code available for payment source | Admitting: *Deleted

## 2023-04-19 DIAGNOSIS — J309 Allergic rhinitis, unspecified: Secondary | ICD-10-CM | POA: Diagnosis not present

## 2023-04-26 ENCOUNTER — Ambulatory Visit (INDEPENDENT_AMBULATORY_CARE_PROVIDER_SITE_OTHER): Payer: No Typology Code available for payment source

## 2023-04-26 DIAGNOSIS — J309 Allergic rhinitis, unspecified: Secondary | ICD-10-CM

## 2023-04-27 IMAGING — CR DG CERVICAL SPINE COMP WITH FLEX & EXTEND
1 series · 7 of 7 positions shown · non-contrast
Comparison: None available at time of interpretation

CLINICAL DATA: Cervicalgia

EXAM:
CERVICAL SPINE COMPLETE WITH FLEXION AND EXTENSION VIEWS

[Series 1: dg cervical spine with flex & extend · 0.14mm/px · 7 of 7 slices shown]
[im 1/7]
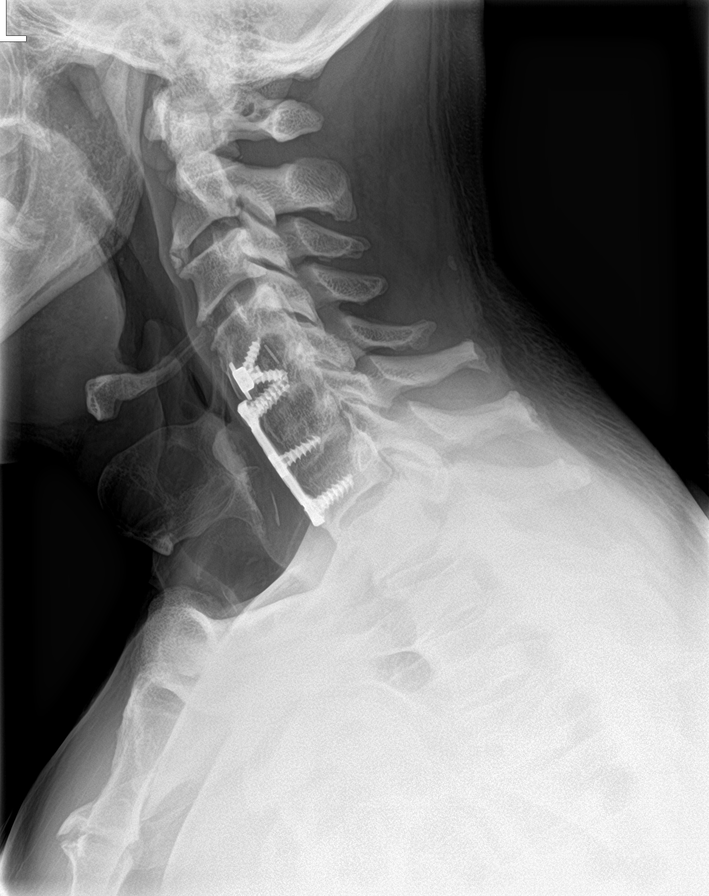
[im 2/7]
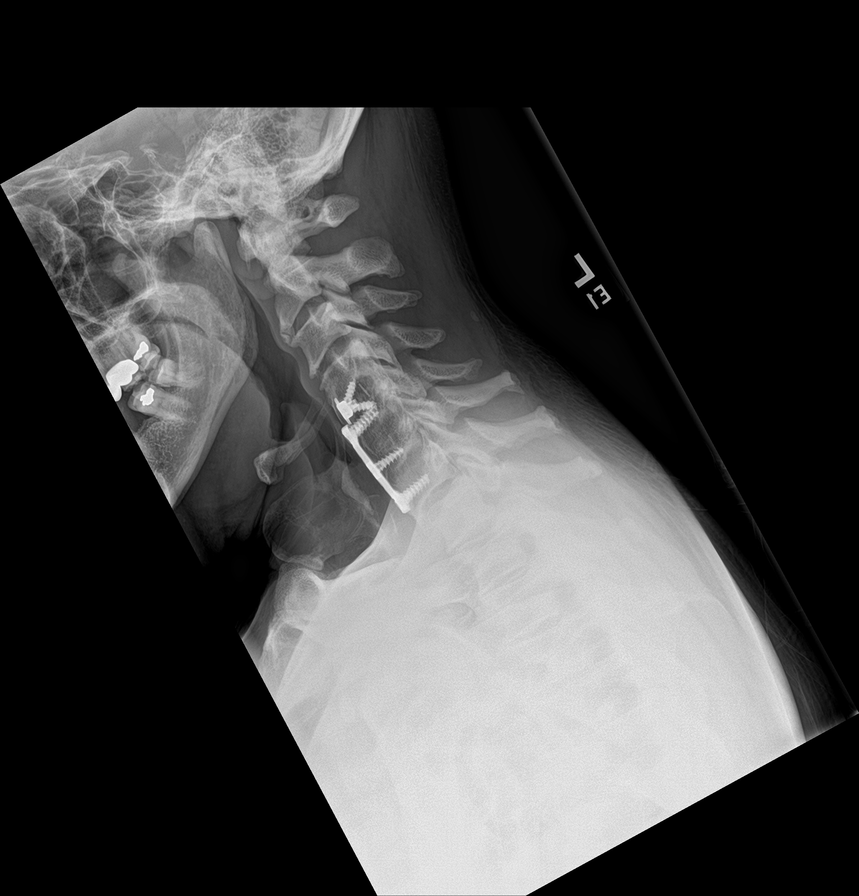
[im 3/7]
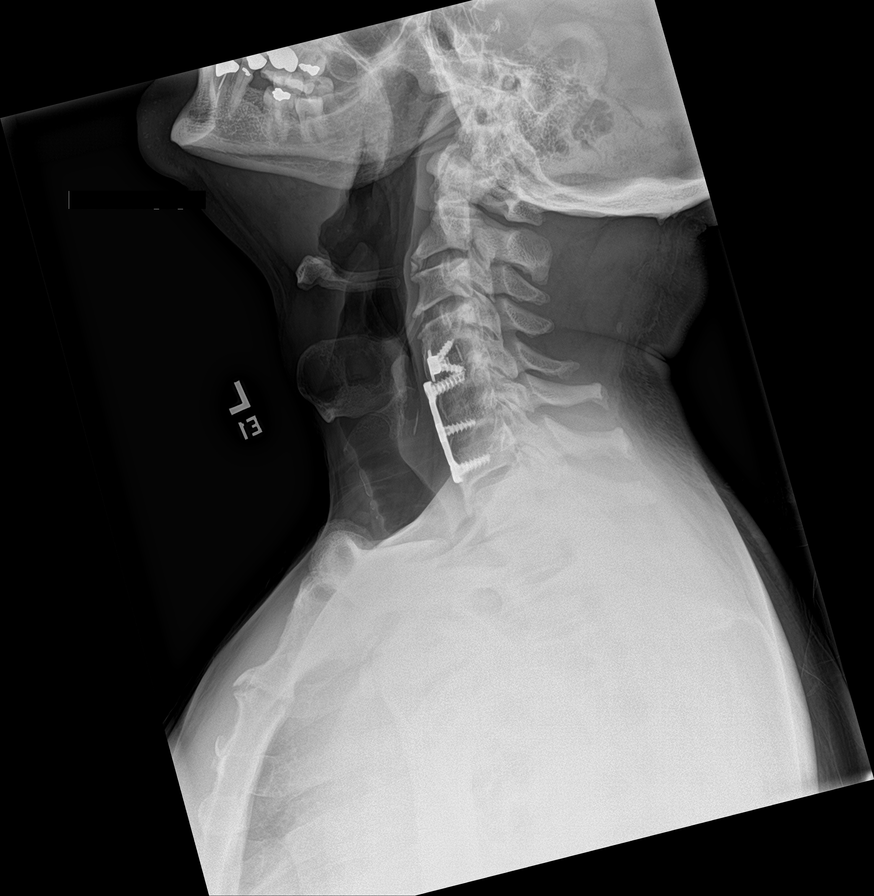
[im 4/7]
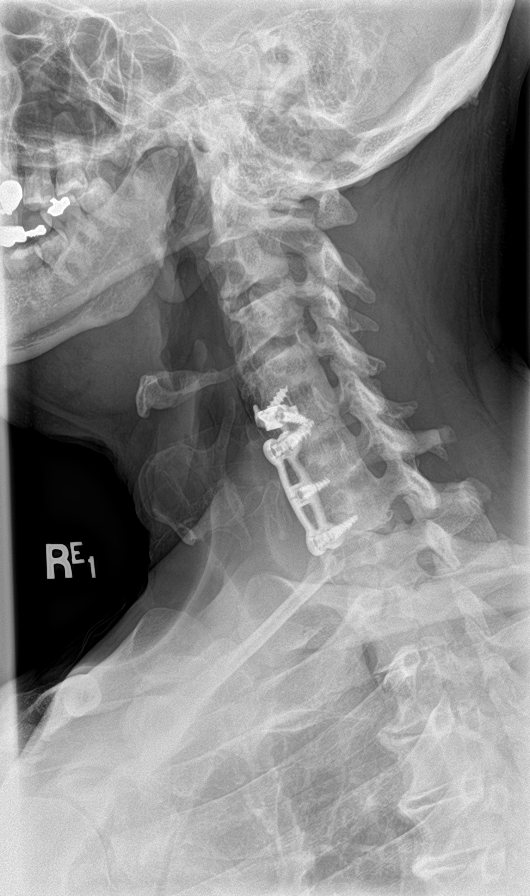
[im 5/7]
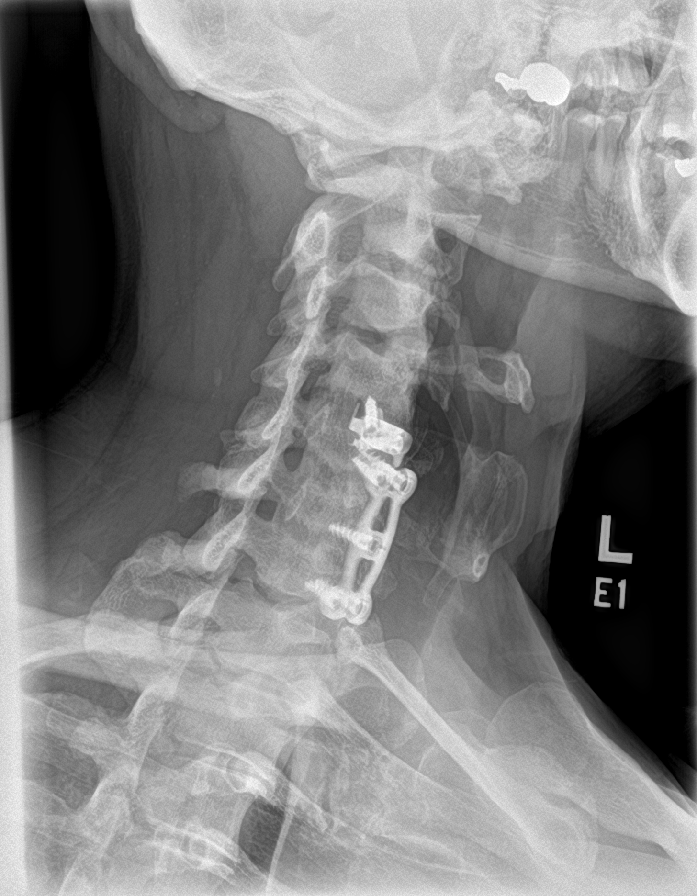
[im 6/7]
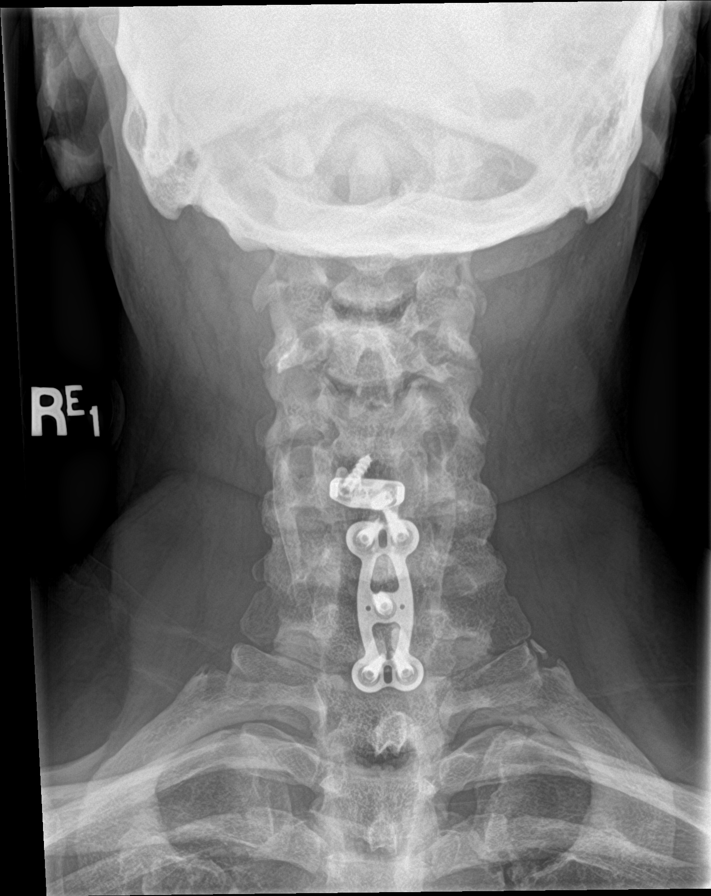
[im 7/7]
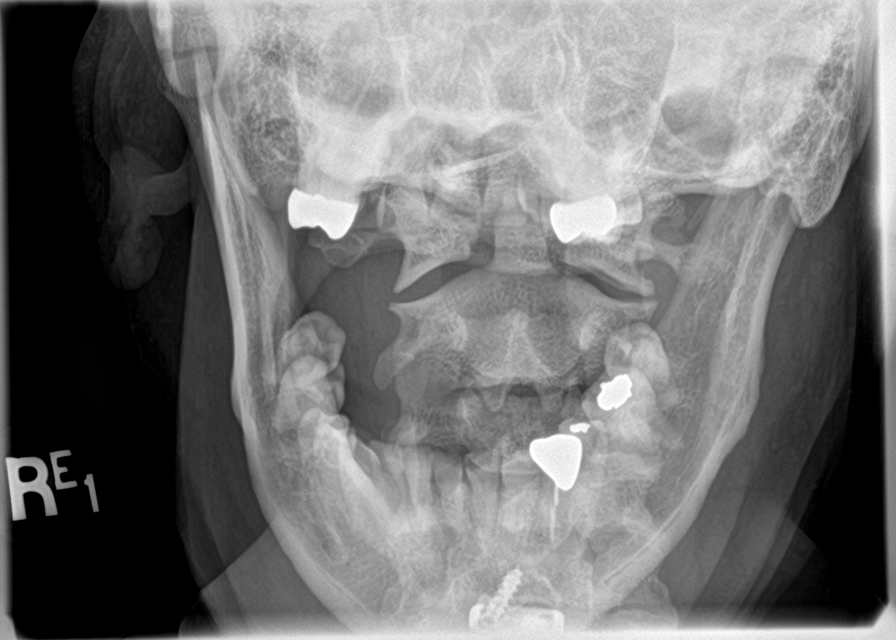

[7 of 7 positions shown; findings below may reference images not displayed]

FINDINGS: The cervical spine is visualized to the level of the C7 vertebral
body on the lateral view. The patient is status post ACDF hardware
spanning C4-C7. No findings of hardware malalignment or fracture.
There straightening of the cervical spine status post fusion. No
dynamic instability on flexion-extension views. No significant disc
space height loss is appreciated above the fused levels. The
vertebral body heights are maintained without compression fracture.
There appears to be mature osseous fusion in the lower cervical
spine. The prevertebral soft tissues are unremarkable.
IMPRESSION: Status post anterior cervical fusion spanning C4-C7 without
malalignment or hardware complicating feature. No significant disc
disease appreciated above the level of fusion.

## 2023-05-04 DIAGNOSIS — J301 Allergic rhinitis due to pollen: Secondary | ICD-10-CM | POA: Diagnosis not present

## 2023-05-04 NOTE — Progress Notes (Signed)
 VIAL ONE MADE 05-04-23. EXP 05-03-24

## 2023-05-05 DIAGNOSIS — J302 Other seasonal allergic rhinitis: Secondary | ICD-10-CM | POA: Diagnosis not present

## 2023-05-05 NOTE — Progress Notes (Signed)
 VIAL 2 MADE 05-05-23. EXP 05-04-24

## 2023-05-06 ENCOUNTER — Ambulatory Visit (INDEPENDENT_AMBULATORY_CARE_PROVIDER_SITE_OTHER): Payer: No Typology Code available for payment source | Admitting: Allergy & Immunology

## 2023-05-06 ENCOUNTER — Encounter: Payer: Self-pay | Admitting: Allergy & Immunology

## 2023-05-06 ENCOUNTER — Other Ambulatory Visit: Payer: Self-pay

## 2023-05-06 VITALS — BP 114/70 | HR 70 | Temp 98.6°F | Ht 70.0 in | Wt 211.4 lb

## 2023-05-06 DIAGNOSIS — L719 Rosacea, unspecified: Secondary | ICD-10-CM | POA: Diagnosis not present

## 2023-05-06 DIAGNOSIS — J302 Other seasonal allergic rhinitis: Secondary | ICD-10-CM

## 2023-05-06 DIAGNOSIS — J3089 Other allergic rhinitis: Secondary | ICD-10-CM

## 2023-05-06 MED ORDER — CHLORPHENIRAMINE MALEATE 4 MG PO TABS: 4.0000 mg | ORAL_TABLET | Freq: Once | ORAL | 3 refills | Status: AC

## 2023-05-06 MED ORDER — MONTELUKAST SODIUM 10 MG PO TABS: 10.0000 mg | ORAL_TABLET | Freq: Every day | ORAL | 3 refills | Status: AC

## 2023-05-06 NOTE — Patient Instructions (Addendum)
 1. Seasonal and perennial allergic rhinitis - Previous testing was positive to: weeds, trees, indoor molds, dust mites, and cockroach. - Avoidance measures provided. - Continue with: Singulair (montelukast) 10mg  daily and chlorpheniramine 4 mg at night. - Continue with shots at the same schedule.   2. Return in about 1 year (around 05/05/2024). You can have the follow up appointment with Dr. Dellis Anes or a Nurse Practicioner (our Nurse Practitioners are excellent and always have Physician oversight!).    Please inform us of any Emergency Department visits, hospitalizations, or changes in symptoms. Call us before going to the ED for breathing or allergy symptoms since we might be able to fit you in for a sick visit. Feel free to contact us anytime with any questions, problems, or concerns.  It was a pleasure to see you again today!  Websites that have reliable patient information: 1. American Academy of Asthma, Allergy, and Immunology: www.aaaai.org 2. Food Allergy Research and Education (FARE): foodallergy.org 3. Mothers of Asthmatics: http://www.asthmacommunitynetwork.org 4. American College of Allergy, Asthma, and Immunology: www.acaai.org      "Like" [place] on Facebook and Instagram for our latest updates!      A healthy democracy works best when [employer] participate! Make sure you are registered to vote! If you have moved or changed any of your contact information, you will need to get this updated before voting! Scan the QR codes below to learn more!

## 2023-05-06 NOTE — Progress Notes (Signed)
 FOLLOW UP  Date of Service/Encounter:  05/06/23   Assessment:   Seasonal and perennial allergic rhinitis (weeds, trees, indoor molds, dust mites, and cockroach) - on allergen immunotherapy with maintenance reached February 2024   Rosacea    Retired veteran    Plan/Recommendations:   1. Seasonal and perennial allergic rhinitis - Previous testing was positive to: weeds, trees, indoor molds, dust mites, and cockroach. - Avoidance measures provided. - Continue with: Singulair (montelukast) 10mg  daily and chlorpheniramine 4 mg at night. - Continue with shots at the same schedule.   2. Return in about 1 year (around 05/05/2024). You can have the follow up appointment with Dr. Dellis Anes or a Nurse Practicioner (our Nurse Practitioners are excellent and always have Physician oversight!).   Subjective:   [name] is a 63 y.o. male presenting today for follow up of  Chief Complaint  Patient presents with   Allergic Rhinitis     [name] has a history of the following: Patient Active Problem List   Diagnosis Date Noted   Decreased range of motion of hips (Bilateral) (R>L) 06/02/2022   Chronic radicular lumbar pain 05/14/2022   Lumbar radicular pain 05/14/2022   Allergic rhinitis, unspecified 05/04/2022   Paresthesia of skin 05/04/2022   Presbyopia 05/04/2022   Encounter for other administrative examinations 05/04/2022   Sacrococcygeal disorders, not elsewhere classified 05/04/2022   Unilateral primary osteoarthritis of hip (Right) 05/04/2022   Abnormal MRI, hip (Right) (02/14/2020) [facility] [place]) 05/04/2022   Abnormal MRI, lumbar spine (02/15/2020) [facility] [place]) 05/04/2022   Lumbar radiculitis (Bilateral) 04/30/2022   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   Trochanteric bursitis of hip (Right) 03/18/2021   Complex renal cyst 03/18/2021   Impotence 03/18/2021   Primary osteoarthritis of knee (Right) 03/18/2021   Carpal tunnel syndrome 03/18/2021    Cervical radiculopathy 03/18/2021   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   Encounter for surgical aftercare following surgery on the nervous system 03/18/2021   Enlarged prostate 03/18/2021   Gross hematuria 03/18/2021   Impingement syndrome of shoulder region 03/18/2021   Kidney stone 03/18/2021   Lesion of ulnar nerve (Right) 03/18/2021   Male erectile dysfunction, unspecified 03/18/2021   Primary erectile dysfunction 03/18/2021   Obesity 03/18/2021   Obstructive sleep apnea (adult) (pediatric) 03/18/2021   Obstructive sleep apnea of adult 03/18/2021   Sprain of sacroiliac joint 03/18/2021   Osteoarthritis of hip (Right) 03/18/2021   Unspecified disorder of refraction 03/18/2021   Vitamin D deficiency 03/18/2021   Pain in hip (Right) 03/18/2021   Encounter for other preprocedural examination 03/18/2021   Encounter for immunization 03/18/2021   Counseling, unspecified 03/18/2021   Pain in shoulder (Left) 03/18/2021   Pain in elbow (Right) 03/18/2021   Gluteal tendinopathy (Right) 03/18/2021   DDD (degenerative disc disease), lumbosacral 03/18/2021   Lumbar facet hypertrophy (Multilevel) (Bilateral) 03/18/2021   Lumbar foraminal stenosis (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, L4-5) (Left: L5-S1) 03/18/2021   Chronic hip pain (1ry area of Pain) (Right) 03/17/2021   Enthesopathy of hip region (Right) 03/17/2021   Chronic groin pain (Right) 03/17/2021   Chronic low back pain (Right) w/o sciatica 03/17/2021   Lumbar facet syndrome (Bilateral) (R>L) 03/17/2021   Chronic shoulder pain (3ry area of Pain) (Bilateral) (R>L) 03/17/2021   Chronic neck pain with history of cervical spinal surgery (4th area of Pain) 03/17/2021   Chronic pain syndrome 03/13/2021   Pharmacologic therapy 03/13/2021   Disorder of skeletal system 03/13/2021   Problems influencing health status  03/13/2021   Chronic knee pain (5th area of Pain) (Right) 11/25/2017   Postlaminectomy syndrome, cervical region  04/12/2013   Chronic low back pain (2ry area of Pain) (Midline) w/o sciatica 02/06/2013   Chronic shoulder pain (Right) 12/30/2012   Hyperlipidemia 10/11/2011   Gout 10/11/2011   Acne 10/11/2011    History obtained from: chart review and patient.  Discussed the use of AI scribe software for clinical note transcription with the patient and/or guardian, who gave verbal consent to proceed.  [name] is a 63 y.o. male presenting for a follow up visit.  He was last seen in August 2024.  At that time, we continue with Singulair and chlorpheniramine.  He also wanted to restart his allergen immunotherapy.  At that time, he had been off of them for 3 months.  Since last visit, he has largely done well.  He is currently receiving allergy shots every two weeks as part of his maintenance therapy, which he has been on for a year. He plans to continue this therapy for at least three years, potentially up to five years. No recent sinus infections and no significant reactions to the shots, except for a slight reaction on one occasion. No swelling or itching around the injection site.  [name] is on allergen immunotherapy. He receives two injections. Immunotherapy script #1 contains trees and weeds. He currently receives 0.39mL of the RED vial (1/100). Immunotherapy script #2 contains molds and cockroach. He currently receives 0.58mL of the RED vial (1/100). He started shots September of 2023 and reached maintenance in February of 2024.  He has not had any large local reactions. He did feel better with the shots on board. He remains interested   He is taking montelukast (Singulair) and chlorpheniramine (Chlortab) as part of his allergy management. He receives his medications from the [place] pharmacy in [place] and confirms that he does not need a new EpiPen as he has some available.  He recently moved to [place] after purchasing a home but continues to visit [facility] for his allergy shots, as it fits into his routine of  visiting the park with his brother.  No recent sinus infections, swelling, or itching around the injection site, and no rashes.     Otherwise, there have been no changes to his past medical history, surgical history, family history, or social history.    Review of systems otherwise negative other than that mentioned in the HPI.    Objective:   Blood pressure 114/70, pulse 70, temperature 98.6 F (37 C), temperature source Temporal, height 5\' 10"  (1.778 m), weight 211 lb 6.4 oz (95.9 kg), SpO2 98%. Body mass index is 30.33 kg/m.    Physical Exam Vitals reviewed.  Constitutional:      Appearance: He is well-developed.     Comments: Very pleasant.  Talkative. Very nice.   HENT:     Head: Normocephalic and atraumatic.     Right Ear: Tympanic membrane, ear canal and external ear normal. No drainage, swelling or tenderness. Tympanic membrane is not injected, scarred, erythematous, retracted or bulging.     Left Ear: Tympanic membrane, ear canal and external ear normal. No drainage, swelling or tenderness. Tympanic membrane is not injected, scarred, erythematous, retracted or bulging.     Nose: No nasal deformity, septal deviation, mucosal edema or rhinorrhea.     Right Turbinates: Enlarged, swollen and pale.     Left Turbinates: Enlarged, swollen and pale.     Right Sinus: No maxillary sinus tenderness or frontal  sinus tenderness.     Left Sinus: No maxillary sinus tenderness or frontal sinus tenderness.     Comments: No nasal polyps.     Mouth/Throat:     Mouth: Mucous membranes are not pale and not dry.     Pharynx: Uvula midline.  Eyes:     General: Lids are normal. Allergic shiner present.        Right eye: No discharge.        Left eye: No discharge.     Conjunctiva/sclera: Conjunctivae normal.     Right eye: Right conjunctiva is not injected. No chemosis.    Left eye: Left conjunctiva is not injected. No chemosis.    Pupils: Pupils are equal, round, and reactive to  light.  Cardiovascular:     Rate and Rhythm: Normal rate and regular rhythm.     Heart sounds: Normal heart sounds.  Pulmonary:     Effort: Pulmonary effort is normal. No tachypnea, accessory muscle usage or respiratory distress.     Breath sounds: Normal breath sounds. No wheezing, rhonchi or rales.     Comments: Moving air well in all lung fields. No increased work of breathing noted.  Chest:     Chest wall: No tenderness.  Lymphadenopathy:     Head:     Right side of head: No submandibular, tonsillar or occipital adenopathy.     Left side of head: No submandibular, tonsillar or occipital adenopathy.     Cervical: No cervical adenopathy.  Skin:    General: Skin is warm.     Capillary Refill: Capillary refill takes less than 2 seconds.     Coloration: Skin is not pale.     Findings: No abrasion, erythema, petechiae or rash. Rash is not papular, urticarial or vesicular.     Comments: No wheezing or crackles noted.   Neurological:     Mental Status: He is alert.  Psychiatric:        Behavior: Behavior is cooperative.      Diagnostic studies: none        Malachi Bonds, MD  [facility]

## 2023-05-10 ENCOUNTER — Ambulatory Visit (INDEPENDENT_AMBULATORY_CARE_PROVIDER_SITE_OTHER): Admitting: *Deleted

## 2023-05-10 DIAGNOSIS — J309 Allergic rhinitis, unspecified: Secondary | ICD-10-CM

## 2023-05-24 ENCOUNTER — Ambulatory Visit (INDEPENDENT_AMBULATORY_CARE_PROVIDER_SITE_OTHER)

## 2023-05-24 DIAGNOSIS — J309 Allergic rhinitis, unspecified: Secondary | ICD-10-CM

## 2023-06-08 ENCOUNTER — Ambulatory Visit (INDEPENDENT_AMBULATORY_CARE_PROVIDER_SITE_OTHER): Admitting: *Deleted

## 2023-06-08 DIAGNOSIS — J309 Allergic rhinitis, unspecified: Secondary | ICD-10-CM

## 2023-06-21 ENCOUNTER — Ambulatory Visit (INDEPENDENT_AMBULATORY_CARE_PROVIDER_SITE_OTHER)

## 2023-06-21 DIAGNOSIS — J309 Allergic rhinitis, unspecified: Secondary | ICD-10-CM | POA: Diagnosis not present

## 2023-06-28 ENCOUNTER — Ambulatory Visit (INDEPENDENT_AMBULATORY_CARE_PROVIDER_SITE_OTHER)

## 2023-06-28 DIAGNOSIS — J309 Allergic rhinitis, unspecified: Secondary | ICD-10-CM

## 2023-07-05 ENCOUNTER — Ambulatory Visit (INDEPENDENT_AMBULATORY_CARE_PROVIDER_SITE_OTHER)

## 2023-07-05 DIAGNOSIS — J309 Allergic rhinitis, unspecified: Secondary | ICD-10-CM

## 2023-08-04 ENCOUNTER — Ambulatory Visit (INDEPENDENT_AMBULATORY_CARE_PROVIDER_SITE_OTHER)

## 2023-08-04 DIAGNOSIS — J309 Allergic rhinitis, unspecified: Secondary | ICD-10-CM | POA: Diagnosis not present

## 2023-08-30 ENCOUNTER — Ambulatory Visit (INDEPENDENT_AMBULATORY_CARE_PROVIDER_SITE_OTHER)

## 2023-08-30 DIAGNOSIS — J309 Allergic rhinitis, unspecified: Secondary | ICD-10-CM | POA: Diagnosis not present

## 2023-09-27 ENCOUNTER — Ambulatory Visit (INDEPENDENT_AMBULATORY_CARE_PROVIDER_SITE_OTHER)

## 2023-09-27 DIAGNOSIS — J309 Allergic rhinitis, unspecified: Secondary | ICD-10-CM | POA: Diagnosis not present

## 2023-10-08 ENCOUNTER — Telehealth: Payer: Self-pay | Admitting: Allergy & Immunology

## 2023-10-08 NOTE — Telephone Encounter (Signed)
 Spoke with pt and informed him that current [place] Authorization expires on 11/05/2023 and that a new authorization has been sent in and for him to call the [place] and get a new authorization.

## 2023-10-18 ENCOUNTER — Ambulatory Visit (INDEPENDENT_AMBULATORY_CARE_PROVIDER_SITE_OTHER)

## 2023-10-18 DIAGNOSIS — J309 Allergic rhinitis, unspecified: Secondary | ICD-10-CM

## 2023-10-18 NOTE — Telephone Encounter (Signed)
 [place] has [place] and added to the chart

## 2023-11-16 ENCOUNTER — Ambulatory Visit (INDEPENDENT_AMBULATORY_CARE_PROVIDER_SITE_OTHER)

## 2023-11-16 DIAGNOSIS — J309 Allergic rhinitis, unspecified: Secondary | ICD-10-CM

## 2023-12-14 ENCOUNTER — Ambulatory Visit

## 2023-12-14 DIAGNOSIS — J309 Allergic rhinitis, unspecified: Secondary | ICD-10-CM | POA: Diagnosis not present

## 2023-12-22 DIAGNOSIS — J301 Allergic rhinitis due to pollen: Secondary | ICD-10-CM | POA: Diagnosis not present

## 2023-12-22 NOTE — Progress Notes (Signed)
 VIALS MADE 12-22-23

## 2023-12-23 DIAGNOSIS — J302 Other seasonal allergic rhinitis: Secondary | ICD-10-CM | POA: Diagnosis not present

## 2024-01-12 ENCOUNTER — Ambulatory Visit

## 2024-01-12 DIAGNOSIS — J309 Allergic rhinitis, unspecified: Secondary | ICD-10-CM | POA: Diagnosis not present

## 2024-02-08 ENCOUNTER — Ambulatory Visit

## 2024-02-08 DIAGNOSIS — J309 Allergic rhinitis, unspecified: Secondary | ICD-10-CM | POA: Diagnosis not present

## 2024-02-16 ENCOUNTER — Ambulatory Visit (INDEPENDENT_AMBULATORY_CARE_PROVIDER_SITE_OTHER)

## 2024-02-16 DIAGNOSIS — J309 Allergic rhinitis, unspecified: Secondary | ICD-10-CM | POA: Diagnosis not present

## 2024-02-23 ENCOUNTER — Ambulatory Visit (INDEPENDENT_AMBULATORY_CARE_PROVIDER_SITE_OTHER)

## 2024-02-23 DIAGNOSIS — J309 Allergic rhinitis, unspecified: Secondary | ICD-10-CM | POA: Diagnosis not present

## 2024-03-20 ENCOUNTER — Ambulatory Visit

## 2024-03-20 DIAGNOSIS — J302 Other seasonal allergic rhinitis: Secondary | ICD-10-CM

## 2024-05-04 ENCOUNTER — Ambulatory Visit: Payer: No Typology Code available for payment source | Admitting: Allergy & Immunology
